# Patient Record
Sex: Female | Born: 1950 | Race: Black or African American | Hispanic: No | Marital: Married
Health system: Midwestern US, Academic
[De-identification: ages and names within clinical notes are randomized; demographics above are authoritative.]

## PROBLEM LIST (undated history)

## (undated) LAB — HEPATIC FUNCTION PANEL
ALT: 12 U/L
AST: 24 U/L
Albumin: 4.2 g/dL (ref 3.5–5.0)
Alkaline Phosphatase: 77 U/L
Total Bilirubin: 0.3 mg/dL (ref 0.1–1.4)
Total Protein: 7.8 g/dL (ref 6.4–8.2)

## (undated) LAB — RENAL FUNCTION PANEL W/O EGFR
BUN: 14 mg/dL (ref 4–21)
Calcium: 9.7 mg/dL (ref 8.7–10.7)
Chloride: 104 mmol/L (ref 99–108)
Creatinine: 0.89
Potassium: 4.3 mmol/L (ref 3.4–5.3)
Sodium: 142 mmol/L (ref 137–147)

## (undated) LAB — CBC
Hematocrit: 36.5 % (ref 36–46)
Hemoglobin: 12.5 g/dL (ref 12.0–16.0)
MCV: 88.4 fL (ref 82.0–108.0)
Platelets: 244
WBC: 4.6 10^3/mL

## (undated) LAB — RENAL FUNCTION PANEL W/EGFR
GFR MDRD Af Amer: 78
GFR MDRD Non Af Amer: 64

## (undated) LAB — HM COLONOSCOPY: HM Colonoscopy: NORMAL

## (undated) LAB — HM PAP SMEAR: HM Pap smear: NORMAL

---

## 2007-03-01 LAB — COMPREHENSIVE METABOLIC PANEL
A/G Ratio: 1.2 (ref 1.0–2.1)
ALT: 18 units/L (ref 6–40)
AST: 23 units/L (ref 10–35)
Albumin: 4.7 g/dL (ref 3.6–5.1)
Alkaline Phosphatase: 72 units/L (ref 33–130)
BUN: 15 mg/dL (ref 7–25)
CO2: 27 mmol/L (ref 21–33)
Calcium: 9.8 mg/dL (ref 8.6–10.2)
Chloride: 103 mmol/L (ref 98–110)
Creatinine: 0.9 mg/dL (ref 0.50–1.20)
GFR MDRD Af Amer: >60 mL/min (ref >=60)
GFR MDRD Non Af Amer: >60 mL/min (ref >=60)
Globulin, Total: 3.8 g/dL (ref 2.2–3.9)
Glucose: 75 mg/dL (ref 65–99)
Potassium: 4.7 mmol/L (ref 3.5–5.3)
Sodium: 138 mmol/L (ref 135–146)
Total Bilirubin: 0.4 mg/dL (ref 0.2–1.2)
Total Protein: 8.5 g/dL (ref 6.2–8.3)

## 2007-03-01 LAB — LIPID PANEL
Chol/HDL Ratio: 3.1 (ref <=5.0)
Cholesterol, Total: 206 mg/dL (ref 125–200)
HDL: 67 mg/dL (ref >=46)
LDL Cholesterol: 121 mg/dL (ref <130)
Triglycerides: 90 mg/dL (ref <150)

## 2007-03-01 NOTE — Unmapped (Signed)
Signed by Ronn Melena MD on 03/01/2007 at 00:00:00  Privacy Notice      Imported By: Scharlene Corn 03/04/2007 08:08:04    _____________________________________________________________________    External Attachment:    Please see [facility] EMR for this document.

## 2007-03-01 NOTE — Unmapped (Signed)
Signed by   [employer] on 03/01/2007 at 22:26:03  Patient: Amy Mcmillan  Note: All result statuses are Final unless otherwise noted.    Tests: (1) LIPID PANEL WITH REFLEX TO DIRECT LDL (QDL-14852)    TRIGLYCERIDES             90 mg/dL                    <469    CHOLESTEROL, TOTAL   [H]  206 mg/dL                   629-528    HDL CHOLESTEROL           67 mg/dL                    > OR = 46   LDL-CHOLESTEROL (calc)                              121 mg/dL                   <413             DESIRABLE RANGE <100 MG/DL FOR PATIENTS WITH CHD OR      DIABETES AND <70 MG/DL FOR DIABETIC PATIENTS WITH      KNOWN HEART DISEASE.          CHOL/HDLC RATIO (calc)                              3.1                         < OR = 5.0    Note: An exclamation mark (!) indicates a result that was not dispersed into   the flowsheet.  Document Creation Date: 03/01/2007 10:26 PM  _______________________________________________________________________    (1) Order result status: Final  Collection or observation date-time: 03/01/2007  Requested date-time:   Receipt date-time: 03/01/2007 20:40  Reported date-time: 03/01/2007 22:00  Referring Physician:    Ordering Physician:  Edrick Amidon [phone])  Specimen Source: S  Source: Lucien Mons Order Number: [id number]  Lab site: [facility]      [address]      [place]  [place]  [address]

## 2007-03-01 NOTE — Unmapped (Signed)
Signed by Sonia Side MA on 03/01/2007 at 07:54:57      Preload Clinical Lists   Problems added:   SPINAL STENOSIS, LUMBAR (ICD-724.02)  ALLERGIC RHINITIS (ICD-477.9)  OTHER ACNE (ICD-706.1)    Medications added:   ALLEGRA 180 MG  TABS (FEXOFENADINE HCL) as needed  B COMPLEX   TABS (B COMPLEX VITAMINS) daily  ENTEX   SUSP (PSEUDOEPHEDRINE TANNATE SUSP) as needed  VITAMIN D   TABS (CHOLECALCIFEROL TABS) daily  CALCIUM   TABS (CALCIUM TABS) daily      Past History  Past Medical History:  IBS, CTS-left hand  Surgical History:  BTL, breast lumpectomy    Family History: Mother - deceased, DM, uterine cancer  Cousin - colon cancer  Social History: Marital Status: married,   Alcohol Use: none  Tobacco Usage:non-smoker                Tetanus/Td Immunization History:     Tetanus/Td # 1:  Historical (08/18/2003)    Meningococcal Immunization History:     Meningococcal # 1:  Historical (02/08/1994)

## 2007-03-01 NOTE — Unmapped (Signed)
Signed by Edrick Beaverdale MD on 03/04/2007 at 12:13:03      Reason for Visit   Chief Complaint: CPE no pap    History from: patient    Allergies  No Known Allergies    Medications   ALLEGRA 180 MG  TABS (FEXOFENADINE HCL) as needed  B COMPLEX   TABS (B COMPLEX VITAMINS) daily  VITAMIN D   TABS (CHOLECALCIFEROL TABS) daily  CALCIUM   TABS (CALCIUM TABS) daily  FLAXSEED OIL   CAPS (FLAXSEED (LINSEED) CAPS) daily        Vital Signs:   Ht: 64 in.  Wt: 156 lbs.      BMI: 26.87  BSA: 1.76    Temperature: 98.2  degrees F  oral  Pulse: 72 (regular)    Patient appears to be in acute distress: no  BP: 132/76  Cuff size: large    Intake recorded by: Sonia Side MA on March 01, 2007 8:44 AM    Audiometry Screening   Left ear-500 hz: 25  Right ear-500 hz: 25  Left ear-1000 hz: 25  Right ear-1000 hz: 25  Left ear-2000 hz: 25  Right ear-2000 hz: 25  Left ear-4000 hz: 25  Right ear-4000 hz: 25          History of Present Illness     Menses   Still having periods? No  Menopausal Symptoms: hot flashes, vaginal dryness  Age at Menopause: 66, Natural  Comments: Last mammogram 11/08, wnl.  Next due in 1 year.  Wm Jorene Minors is her gynecologist.  Just started to use an OTC lubricant.  Comments: Pelvic exams perfomed by Dr. Jorene Minors.    Breast Cancer Screening   Performs BSE: Yes- reiterated importance of routine breast self exams  Hx Breast Biopsy: Yes  PMH Breast CA: Yes  FH Breast CA: Yes  Recent Breast Exam: yes  Comments: Breast examinations performed by Dr. Jorene Minors.  Recent Mammogram: yes  Mammogram: Normal  Comments: Continuing with annual screening.    Osteoporosis   She does not have documented osteoporosis. There is a family history of osteoporosis. She describes her calcium intake as: Takes a supplement.   Recent Dexascan: yes  Dexa: Abnormal Comments: Dr. Jorene Minors has recommended Boniva.  Pt has not begun to use.  Dexa was stable from 2007 to 2008.    Sexuality   Sexual Contact: yes      Constipation   Quality: hard  stools  Comments: does have daily BMs.  Passes a lot of gas. 4 to 8 oz of water a day only.  Walks daily over lunch break for 2- minutes a day.  Pt had gastric emptying study in 05/2005 which revealed mild gastroparesis.  She has also been evaluated by Bongiovanni who had started her on Zelnorm (per chart review).  Pt is no longer using zelnorm but states that it was effective in relieving sx.  LMP: January 2007    Previous Treatment:   Effective: fiber  Ineffective: laxative, stool softener  Comments: Pt has added fiber to her diet.  Pt has tried stool softeners and laxatives (OTC) without good relief.  Has FH of colon cancer.    Last colonoscopy in 2004.          Pt has spinal stenosis.  Uses Aleve, exercise, and stretching, PT exercises at home to control symptoms.  She gets numbness and tingling down bilat legs.  Has an awkward gait when she first starts walking until she gets stretched out.  She has a fear of crossing streets because she is not as fast or agile as she needs to be and would like a handicap sticker to use as needed.  Does not follow with a neurologist.  Was admitted for radicular symptoms bilat LE 2005, MRI dx was L4-L5 mild spinal stenosis.        Past History  Past Medical History:  spinal stenosis, IBS, CTS-left hand    Review of Systems   Refer to HPI for review of systems documentation.  Gastrointestinal: Complains of see HPI. Denies nausea, vomiting, diarrhea, constipation, change in bowel habits, abdominal pain, melena, hematochezia, jaundice, spitting, encopresis, hematemesis, abdominal distention, edema, ascites, belching, dysphagia, early satiety, heartburn/indigestion, regurgitation/reflux. Pt occassionally uses Pepcid to relieve bloating, doesn't work.  Genitourinary: Denies vaginal discharge, incontinence, dysuria, hematuria, urinary frequency, pelvic pain, incomplete empty, enuresis.   Allergic/Immunologic: Complains of hay fever. uses husband's allegra as needed which is adequate to  control symptoms.      Physical Examination:   BP: 132/  76    Physical Exam- Detail:   General Appearance: well-developed, well-nourished and in no acute distress.  Skin: No suspicious rashes or lesions.  Eyes: Sclera white, conjunctiva without injection and pallor.  PERRLA.  EOMI, nl fundi  Ears: No lesions.  Tympanic membranes translucent, non-bulging.  Canal walls pink, without discharge.  Hearing grossly intact.  Nose/Face: Mucosa and turbinates pink, septum midline. No polyps, no discharge, no lesions.  Oropharynx: Normal appearance.  No erythema, exudate or mass. No tonsillar swelling.  Respiratory: Respiration un-labored.  Lung fields clear to auscultation.  No wheezing, rales, rhonchi or pleural rub.  Neck: No thyromegaly.  No nodules, masses or tenderness.  Lymphatic: Areas palpated not enlarged:  cervical, supraclavicular, axillary.  Cardiac: S1 and S2 normal.  RRR without murmurs, rubs, gallops.  No JVD. ND PMI  Vascular: No carotid bruits.  No edema or varicosities.  Abdomen: No masses or tenderness. Bowel sounds active x4 quad.  Liver and spleen are without tenderness or enlargement.  No hernias.  No palpable stool.  Neurologic: Cranial nerves 2 through 12 intact.  Deep tendon reflexes 2+ bilaterally.  Sensation intact.  No spasticity.  Strength is 5/5 in upper and lower extremities bilaterally.  Psychiatric: Judgement and insight are within normal limits.  Alert and oriented x3.  No mood disorders noted, appropriate affect.  Musculoskeletal: Gait coordinated and smooth.  Digits are without clubbing or cyanosis.           Follow-up for Test Results:     New Problems:  WELL ADULT (ICD-V70.0)  CONSTIPATION (ICD-564.00)  SCREENING FOR LIPOID DISORDERS (ICD-V77.91)  Sx of DECREASED HEARING, LEFT EAR (ICD-389.9)  New Medications:  FLAXSEED OIL   CAPS (FLAXSEED (LINSEED) CAPS) daily  DULCOLAX 5 MG  TBEC (BISACODYL) Take one a day for constipation as needed.  NEURONTIN 300 MG  CAPS (GABAPENTIN) Take one  tablet today, then one tablet in the morning and at night on 11/22, then one tablet a day three times a day.      Preventive Maintenance     Performs Breast Self Exams: Yes- reiterated importance of routine breast self exams    Assessment and Plan  New Problems:  Dx of WELL ADULT (ICD-V70.0)  Onset: 03/01/2007  Dx of CONSTIPATION (ICD-564.00)  Onset: 03/01/2007  Dx of SCREENING FOR LIPOID DISORDERS (ICD-V77.91)  Onset: 03/01/2007  Sx of DECREASED HEARING, LEFT EAR (ICD-389.9)  Onset: 03/01/2007    Medications   New Prescriptions/Refills:  NEURONTIN  300 MG  CAPS (GABAPENTIN) Take one tablet today, then one tablet in the morning and at night on 11/22, then one tablet a day three times a day.  #93 tablets x 0, 03/01/2007, Edrick Ida MD  ALLEGRA 180 MG  TABS (FEXOFENADINE HCL) as needed  #30 x 1, 03/01/2007, Edrick Clay MD  DULCOLAX 5 MG  TBEC (BISACODYL) Take one a day for constipation as needed.  #30 x 0, 03/01/2007, Edrick [place]ton MD    New medications:  FLAXSEED OIL   CAPS -- daily  DULCOLAX 5 MG  TBEC -- Take one a day for constipation as needed.  Start date: 03/01/2007  NEURONTIN 300 MG  CAPS -- Take one tablet today, then one tablet in the morning and at night on 11/22, then one tablet a day three times a day.  Start date: 03/01/2007    Assessment and Plan Comments   1. Constipation- Reviewed helpful diet changes.  Dulcolax to be used every other day for next week.  Then as needed.  2. Decreased hearing- Nl hearing screen. Pt not interested in pursuing audiology evaluation at this time.    3. L4-5 spinal stenosis- Reviewed supportive mgmt and use of frequent position changes and increased activity.  Neurontin for symptoms relief.  Pt to being using at bedtime.   4. Labs drawn for FLP and CMP for cholesterol screening, dm screening.  Will communicate results by letter.  5. Declined flu shot.    Today's Orders   Audiometry / Pure Tone Hearing [CPT-92552]  [id number] - Ofc Vst, Est Level III [MWU-13244]  [id number] -  Preventive, Est, 40-64 yr [CPT-99396]  Lipid Panel w/Reflex to Direct LDL (14852) [CPT-80061]  Comp Metabolic Panel  (METAPNL) (10231) [CPT-80053]    Disposition:   Return to clinic for Doctor Visit as needed   Appointment Reason: Pt prefers not to schedule f/u at this time. Will call with concerns.      Patient Education   Education was provided to: patient  Patient Response: Expressed understanding, Receptive    Topics Discussed:   Medical Condition, Treatment Options, Theraputic Risks & Benefits, Medication Use.  Informed How: Verbally            Prescriptions:  NEURONTIN 300 MG  CAPS (GABAPENTIN) Take one tablet today, then one tablet in the morning and at night on 11/22, then one tablet a day three times a day.  #93 tablets x 0   Entered and Authorized by: Edrick Kingsford MD   Signed by: Edrick De Baca MD on 03/01/2007   Method used: Print then Give to Patient   RxID: [id number]  ALLEGRA 180 MG  TABS (FEXOFENADINE HCL) as needed  #30 x 1   Entered and Authorized by: Edrick [place] MD   Signed by: Edrick Woodbury MD on 03/01/2007   Method used: Print then Give to Patient   RxID: [id number]  DULCOLAX 5 MG  TBEC (BISACODYL) Take one a day for constipation as needed.  #30 x 0   Entered and Authorized by: Edrick Waves MD   Signed by: Edrick [place] MD on 03/01/2007   Method used: Print then Give to Patient   RxID: [id number]                  ]

## 2007-03-01 NOTE — Unmapped (Signed)
Signed by   LinkLogic on 03/01/2007 at 22:26:04  Patient: Amy Mcmillan  Note: All result statuses are Final unless otherwise noted.    Tests: (1) COMPREHENSIVE METABOLIC PANEL W/EGFR (QDL-10231)    GLUCOSE                   75 mg/dL                    87-56                  FASTING REFERENCE INTERVAL    UREA NITROGEN (BUN)       15 mg/dL                    4-33    CREATININE                0.9 mg/dL                   0.50-1.20   eGFR NON-AFR. AMERICAN                              >60 mL/min/1.21m2           > OR = 60   eGFR AFRICAN AMERICAN                              >60 mL/min/1.55m2           > OR = 60   BUN/CREATININE RATIO (calc)                              NOT APPLICABLE              6-22      BUN/CREATININE RATIO IS NOT REPORTED WHEN THE BUN      AND CREATININE VALUES ARE WITHIN NORMAL LIMITS.    SODIUM                    138 mmol/L                  135-146    POTASSIUM                 4.7 mmol/L                  3.5-5.3    CHLORIDE                  103 mmol/L                  98-110    CARBON DIOXIDE            27 mmol/L                   21-33    CALCIUM                   9.8 mg/dL                   2.9-51.8    PROTEIN, TOTAL       [H]  8.5 g/dL                    8.4-1.6    ALBUMIN  4.7 g/dL                    5.9-5.6    GLOBULIN (calc)           3.8 g/dL                    3.8-7.5   ALBUMIN/GLOBULIN RATIO (calc)                              1.2                         1.0-2.1    BILIRUBIN, TOTAL          0.4 mg/dL                   6.4-3.3    ALKALINE PHOSPHATASE      72 U/L                      33-130    AST                       23 U/L                      10-35    ALT                       18 U/L                      6-40    Note: An exclamation mark (!) indicates a result that was not dispersed into   the flowsheet.  Document Creation Date: 03/01/2007 10:26 PM  _______________________________________________________________________    (1) Order result status: Final  Collection or  observation date-time: 03/01/2007  Requested date-time:   Receipt date-time: 03/01/2007 20:40  Reported date-time: 03/01/2007 22:00  Referring Physician:    Ordering Physician:  Edrick Akeley [phone])  Specimen Source: S  Source: Lucien Mons Order Number: [id number]  Lab site: [facility]      [address]      [place]  [facility]  [address]      -----------------    The following non-numeric lab results were dispersed to  the flowsheet even though numeric results were expected:      BUN/CREATININE RATIO (calc), NOT APPLICABLE

## 2007-03-05 NOTE — Unmapped (Signed)
Signed by Edrick Franklin MD on 03/05/2007 at 15:14:03              March 05, 2007      Amy Mcmillan  [address]    [place], [place] [address]                                                                                           RE:  TEST RESULTS   MCPHEE Loja--11-03-1950)         Dear [name]:      The following is an interpretation of your most recent tests.  Please take note of any instructions provided.  Electrolyte studies:  Normal, Blood sugar normal         Glucose test normal:  75   Kidney function studies:  Normal    Liver function studies:  Normal    Lipid panel:   Normal - Total cholesterol is a little high at 206.  Normal is less than 200.       Triglyceride: 90   Cholesterol: 206   LDL: 121   HDL: 67   Chol/HDL%:  3.1       Additional Comments: Please review the enclosed information on high cholesterol.  It describes ways to change your diet to lower your cholesterol.  I think that any changes that you can make now will help keep your cholesterol from becoming a problem.  Please contact me with any questions.           Thank you,        Edrick Ashley MD

## 2007-06-28 NOTE — Unmapped (Signed)
Signed by Tresa Endo Dartis MA on 07/01/2007 at 09:13:34    Phone Note   Patient Call  Call back at Home Phone: [phone]  Caller: patient  Call for: Azalia Neuberger    Complaint: urinary problems  Summary of Call: Pt needs rx written for allegra d 1 daily as needed # 90 day supply   for new insurance [facility]   pt will pick up rx        Initial call taken by: Hardie Lora MA,  June 28, 2007 3:26 PM      Follow-up for Phone Call   Please print Rx and have another physician sign it or place it on my desk and i will sign on Monday.  Thank you.  Follow-up by: Edrick [place] MD,  June 28, 2007 5:01 PM    Additional Follow-up for Phone Call   Pick up rx in front office  phone call completed, left message for patient  Additional Follow-up by: Hardie Lora MA,  July 01, 2007 9:13 AM    Prescriptions:  ALLEGRA 180 MG  TABS (FEXOFENADINE HCL) as needed  #90 x 3   Entered and Authorized by: Edrick Kidron MD   Signed by: Tresa Endo Dartis MA on 07/01/2007   Method used: Print then Give to Patient   RxID: [id number]

## 2007-08-01 NOTE — Unmapped (Signed)
Signed by Edrick [place] MD on 08/05/2007 at 13:24:51      Reason for Visit   Chief Complaint: rash underarms, itchy, draining x 2 weeks    History from: patient    Allergies  ! * LATEX TAPE    Medications   ALLEGRA 180 MG  TABS (FEXOFENADINE HCL) as needed  B COMPLEX   TABS (B COMPLEX VITAMINS) daily  VITAMIN D   TABS (CHOLECALCIFEROL TABS) daily  CALCIUM   TABS (CALCIUM TABS) daily  FLAXSEED OIL   CAPS (FLAXSEED (LINSEED) CAPS) daily  DULCOLAX 5 MG  TBEC (BISACODYL) Take one a day for constipation as needed.        Vital Signs:   Wt: 158 lbs.      BMI: 27.22  BSA: 1.77  Wt chg (lbs): 2  Temperature: 97.8  degrees F  oral  Pulse: 78  BP: 110/70    Intake recorded by: Vista Lawman on August 01, 2007 4:07 PM    History of Present Illness   Chief Complaint: rash under arms  1. Rash beneath arms- Shaved under her arms with an old razor 1 wk ago.  Rash onset a few days afterward.  This has happened before.  No pain.  Feels wet, itching. Using Benadryl cream for itching. Noted clear drainage from under L arm.    2. Seasonal allergies- +Sinus congestion, rhinorrhea.  Needs refill on Allegra D.          Physical Examination:   BP: 110/  70    Physical Exam- Detail:   General Appearance: Well cared for female, comfortable, pleasant  Skin: Bilat axilla in region that pt shaved: prominent postinflammatory hyperpigmentation, at margins faint erythema and scale.  No pustules, vesicle, ulcers.  Lymphatic: No axillary lymphadenopathy appreciated           New Problems:  CANDIDIASIS, SKIN (ICD-112.3)  New Medications:  ALLEGRA-D 24 HOUR 180-240 MG TB24 (FEXOFENADINE-PSEUDOEPHEDRINE) Take one tablet by mouth daily for allergies  DIFLUCAN 100 MG TABS (FLUCONAZOLE) Take one tablet by mouth daily for fourteen days for rash  HYDROCORTISONE 1 % CREA (HYDROCORTISONE) apply to affected area twice a day as needed for itching  New Allergies:  ! * LATEX TAPE    Preventive Maintenance             Prescriptions:  HYDROCORTISONE 1 % CREA  (HYDROCORTISONE) apply to affected area twice a day as needed for itching  #qs x 0   Entered and Authorized by: Edrick Edinburgh MD   Signed by: Edrick [place] MD on 08/01/2007   Method used: Print then Give to Patient   RxID: [id number]  DIFLUCAN 100 MG TABS (FLUCONAZOLE) Take one tablet by mouth daily for fourteen days for rash  #14 x 0   Entered and Authorized by: Edrick [place] MD   Signed by: Edrick Bliss MD on 08/01/2007   Method used: Print then Give to Patient   RxID: [id number]  ALLEGRA-D 24 HOUR 180-240 MG TB24 (FEXOFENADINE-PSEUDOEPHEDRINE) Take one tablet by mouth daily for allergies  #30 x 3   Entered and Authorized by: Edrick Kountze MD   Signed by: Edrick [place] MD on 08/01/2007   Method used: Print then Give to Patient   RxID: [id number]      Assessment and Plan  New Problems:  Dx of CANDIDIASIS, SKIN (ICD-112.3)  Onset: 08/01/2007    Medications   New Prescriptions/Refills:  HYDROCORTISONE 1 % CREA (HYDROCORTISONE) apply to affected area twice a day as  needed for itching  #qs x 0, 08/01/2007, Edrick [place] MD  DIFLUCAN 100 MG TABS (FLUCONAZOLE) Take one tablet by mouth daily for fourteen days for rash  #14 x 0, 08/01/2007, Edrick [place] MD  ALLEGRA-D 24 HOUR 180-240 MG TB24 (FEXOFENADINE-PSEUDOEPHEDRINE) Take one tablet by mouth daily for allergies  #30 x 3, 08/01/2007, Edrick [place] MD    New medications:  ALLEGRA-D 24 HOUR 180-240 MG TB24 -- Take one tablet by mouth daily for allergies  Start date: 08/01/2007  DIFLUCAN 100 MG TABS -- Take one tablet by mouth daily for fourteen days for rash  Start date: 08/01/2007  HYDROCORTISONE 1 % CREA -- apply to affected area twice a day as needed for itching  Start date: 08/01/2007    Assessment and Plan Comments   1. Cutaneous candidiasis- Diflucan x 14 days, allegra will aid in controlling pruritis.  Call if symptoms fail to improve.  2. Seasonal allergies- Refilled Allegra D.    Today's Orders   99213 - Ofc Vst, Est Level III  [ATF-57322]    Disposition:   as needed

## 2007-08-27 NOTE — Unmapped (Signed)
Signed by Edrick Wabasso MD on 08/27/2007 at 23:00:48      Reason for Visit   Chief Complaint: fup canidasis. still there and spreading.     History from: patient    Allergies  ! * LATEX TAPE    Medications   B COMPLEX   TABS (B COMPLEX VITAMINS) daily  VITAMIN D   TABS (CHOLECALCIFEROL TABS) daily  CALCIUM   TABS (CALCIUM TABS) daily  FLAXSEED OIL   CAPS (FLAXSEED (LINSEED) CAPS) daily  DULCOLAX 5 MG  TBEC (BISACODYL) Take one a day for constipation as needed.  ALLEGRA-D 24 HOUR 180-240 MG TB24 (FEXOFENADINE-PSEUDOEPHEDRINE) Take one tablet by mouth daily for allergies  HYDROCORTISONE 1 % CREA (HYDROCORTISONE) apply to affected area twice a day as needed for itching        Vital Signs:   Wt: 158 lbs.      BMI: 27.22  BSA: 1.77  Wt chg (lbs): 0  Temperature: 98.2  degrees F  oral  Pulse: 76 (regular)    Patient appears to be in acute distress: no  BP: 132/72  Cuff size: large    Intake recorded by: Sonia Side MA on Aug 27, 2007 3:16 PM    History of Present Illness   Chief Complaint: yeast infxn unresolved  1. Yeast infxn under arms did not improve with diflucan.  Continues to have pruritis.  Using topical steroid and anti-itch cream.  No broken skin or d/c.    History of Present Illness: MEDICAL STUDENT NOTE (UC III)  57 yo AAF presents for f/u of axillary candidiasis in bilateral armpit.  The rash has been present for over a month now.  Pt. says the pruritis is being controlled with diphenhydramine cream.  Pt. was given fluconazole but pt. didn't feel like there was any improvement, in fact pt. feels the rash is spreading.  Pt. feels the rash has not changed in quality much     ROS:  Gen:  no fevers  Skin:  no rashes other than bilateral axilla.    PE:  Gen:  A&Ox3, NAD  Skin:  dark brown macular rash in axilla bilaterally,  4cm x 6-7cm, nontender, but pruritic to touch.  No d/c or drainage.    CV:  RRR, no murmurs  Resp: CTAB, no adventitious breath sounds    A/P:56 yo AAF with fluconazole-resistant  candidiasis.  Recommend Nyastatin topical cream, f/u in 2 weeks if no improvement.           Physical Examination:   BP: 132/  72    Physical Exam- Detail:   General Appearance: Well cared for female, comfortable, pleasant  Skin: Bilat axilla in region that pt shaved: prominent postinflammatory hyperpigmentation, at margins faint erythema and scale.  No pustules, vesicle, ulcers.  Lymphatic: No axillary lymphadenopathy appreciated           New Medications:  NYSTATIN 100000 UNIT/GM CREA (NYSTATIN) Apply to affected area three times a day      Preventive Maintenance             Prescriptions:  NYSTATIN 100000 UNIT/GM CREA (NYSTATIN) Apply to affected area three times a day  #qs x 0   Entered and Authorized by: Edrick [place] MD   Signed by: Edrick Stanwood MD on 08/27/2007   Method used: Print then Give to Patient   RxID: [id number]      Assessment and Plan   1. Cutaneous candidiasis- Topical nystatin cream for 2 days past symptom  resolution.      Medications   New Prescriptions/Refills:  NYSTATIN 100000 UNIT/GM CREA (NYSTATIN) Apply to affected area three times a day  #qs x 0, 08/27/2007, Edrick [place]uced[place] MD    Today's Orders   [phone] - Ofc Vst, Est Level III [FAO-13086]

## 2007-10-04 NOTE — Unmapped (Addendum)
Signed by Huel Coventry MD on 10/04/2007 at 16:11:55        Reason for Visit   Chief Complaint: both underarms itching, sore, swollen. wants something stronger. med refills    History from: patient    Allergies  ! * LATEX TAPE    Medications   B COMPLEX   TABS (B COMPLEX VITAMINS) daily  VITAMIN D   TABS (CHOLECALCIFEROL TABS) daily  CALCIUM   TABS (CALCIUM TABS) daily  FLAXSEED OIL   CAPS (FLAXSEED (LINSEED) CAPS) daily  DULCOLAX 5 MG  TBEC (BISACODYL) Take one a day for constipation as needed.  ALLEGRA-D 24 HOUR 180-240 MG TB24 (FEXOFENADINE-PSEUDOEPHEDRINE) Take one tablet by mouth daily for allergies  NYSTATIN 100000 UNIT/GM CREA (NYSTATIN) Apply to affected area three times a day        Vital Signs:   Wt: 158 lbs.      BMI: 27.22  BSA: 1.77  Wt chg (lbs): 0  Temperature: 97.8  degrees F  oral  Pulse: 72 (regular)    Patient appears to be in acute distress: no  BP: 128/70  Cuff size: large    Intake recorded by: Sonia Side MA on October 04, 2007 1:49 PM    History of Present Illness   57yo female here for F/U of B axillary infection.    Using Nystatin under arms twice a day since last visit.  No longer draining but it is still very pruritic and hyperpigmented rash.  Thinks this all started when she shaved in March with a razor of questionable cleanliness.    No fever, chills, lesions elsewhere.  Does mention hard stool, constipation.  No dark tarry stool no blood per rectum.  Coming up on time for repeat colonoscopy.    Past History  Past Medical History (reviewed - no changes required):  spinal stenosis, IBS, CTS-left hand  Social History (reviewed - no changes required): Marital Status: married,   Alcohol Use: none  Tobacco Usage:non-smoker    Review of Systems  Refer to HPI for review of systems documentation.      Physical Examination:   BP: 128/  70    Physical Exam- Detail:   General Appearance: Well cared for female, comfortable, pleasant  Skin: Bilateral axilla with area of hyperpigmentation with  well-defined borders, no satellite lesions, no pustules, vesicle, or ulcers.  No erythema.  Negative Wood lamp exam.  Negative for budding yeast as well as hyphae on KOH slide.  Respiratory: Respiration un-labored.  Lung fields clear to auscultation.  No wheezing, rales, rhonchi or pleural rub.  Cardiac: S1 and S2 normal.  RRR without murmurs, rubs, gallops.    Psychiatric: Judgement and insight are within normal limits.  Alert and oriented x3.  No mood disorders noted, appropriate affect.  Musculoskeletal: Gait coordinated and smooth.        In office Procedures & Tests     Procedure Note:   Type of Procedure: KOH slide prep  Comments: Scrapings of axillary skin placed on slide with KOH; negative on microscopy for  budding yeast,          New Medications:  TERBINAFINE HCL 250 MG TABS (TERBINAFINE HCL) 1 tab by mouth daily for 4 weeks      Preventive Maintenance             Prescriptions:  DULCOLAX 5 MG  TBEC (BISACODYL) Take one a day for constipation as needed.  #30 x 0   Entered and Authorized by:  Joellen Jersey MD   Signed by: Joellen Jersey MD on 10/04/2007   Method used: Print then Give to Patient   RxID: [id number]  TERBINAFINE HCL 250 MG TABS (TERBINAFINE HCL) 1 tab by mouth daily for 4 weeks  #30 x 0   Entered and Authorized by: Joellen Jersey MD   Signed by: Joellen Jersey MD on 10/04/2007   Method used: Print then Give to Patient   RxID: [id number]      Assessment and Plan   57yo female with dermatitis in bilateral axillae.  -- Appear today less c/w Candida, more likely tinea corporis  -- Inadequate response to topical tx, will start terbinafine 250mg  daily for 4 weeks  -- Pt to return in 3-4 weeks to ensure it is clearing up    Constipation:  -- Refill on dulcolax, recommended fiber supplement, also can try Miralax prn    Medications   New Prescriptions/Refills:  DULCOLAX 5 MG  TBEC (BISACODYL) Take one a day for constipation as needed.  #30 x 0, 10/04/2007, Joellen Jersey  MD  TERBINAFINE HCL 250 MG TABS (TERBINAFINE HCL) 1 tab by mouth daily for 4 weeks  #30 x 0, 10/04/2007, Joellen Jersey MD                        Signed by Joellen Jersey MD on 10/04/2007 at 16:22:37

## 2007-10-29 NOTE — Unmapped (Addendum)
Signed by Cheron Schaumann MA on 10/29/2007 at 11:15:00    Dermatology Procedure      OPERATIVE REPORT:  SKIN BIOPSY   Site #1:   left medial axilla  Differential Diagnosis #1:   r/o chronic lichenified dermatitis  Site #2:   left lateral axilla  Differential Diagnosis #2:   r/o chronic lichenified dermatitis    Procedure in Detail:   With the patient in the appropriate position, the perilesional and lesional skin of lesion(s) ofwas scrubbed with  alcohol.    1% lidocaine with epinephrine 1:200000.  Lesional skin was incised with 1/2 blade  The specimen(s) was sent for histopathologic examination.  Hemostasis was obtained by  aluminum chloride.  Estimated blood loss was less than 1 ml.      Discharge Plans:    we will contact 7-10 days.        Patient Instructions:   Topical antibiotic ointment, Band-Aids, and wound care instructions provided.   Written biopsy wound care instructions provided.  Further treatment plans, if applicable, will be made at that time.    Signed by Regis Bill MA on 11/06/2007 at 11:41:33            Pathology Results   Site #1:   L medical axilla      Result #1:   Chronic lichenified dermatitis      Action #1:   ED will go over results at F/u   Site #2:   L lateral axilla     Result #2:   Chronic lichenified dermatitis      Action #2:   ED will go over results at F/u

## 2007-10-29 NOTE — Unmapped (Signed)
Signed by Cheron Schaumann MA on 10/29/2007 at 11:16:30      Dermatopathology Requisition Form   Ordering Provider:   Frazier Richards, md  Biopsy Date:   10/29/2007  Gender:  Female  Patient Race:   Black  Social Security #:  [id number]    Impression:   r/o chronic lichenified dermatitis,     also gms stain    Biopsy Site(s):   left medial axilla  left lateral axilla    Tests Requested:    DermPath Biopsy:  Histopathology [id number] [[id number]]

## 2007-10-29 NOTE — Unmapped (Signed)
Signed by Frazier Richards MD on 10/29/2007 at 12:21:56      History of Present Illness   Chief Complaint:   CHECK RASH UNDER ARM  1. over 5 mo h/o hihgly pruritic eruption under both axillae  no change with nystatin cream  and one month terbinafine cream  (previous KOH negative)  no new deoderants or shaving gels or equipment    2. h/o adult acne well controlled with retin a 0.025%A cream  would lifk erefills  otherwise no known modifying factors            Past History  Past Medical History (reviewed - no changes required):  spinal stenosis, IBS, CTS-left hand  Surgical History (reviewed - no changes required):  BTL, breast lumpectomy    Family History: DENIES FAMILY HX MM  Mother - deceased, DM, uterine cancer  Cousin - colon cancer      Dermatology Past History   Personal History of Skin Cancer/Melanoma:   No  Sunburns Easily:   No  Uses Sunscreen:   No      Intake-Dermatology     Vital Signs     Height: 64   Primary Care Provider: [facility]      Allergies  ! * LATEX TAPE  Allergy and adverse reaction list reviewed during this update.    Current Medications:    Current Meds:   B COMPLEX   TABS (B COMPLEX VITAMINS) daily  VITAMIN D   TABS (CHOLECALCIFEROL TABS) daily  CALCIUM   TABS (CALCIUM TABS) daily  FLAXSEED OIL   CAPS (FLAXSEED (LINSEED) CAPS) daily  DULCOLAX 5 MG  TBEC (BISACODYL) Take one a day for constipation as needed.  ALLEGRA-D 24 HOUR 180-240 MG TB24 (FEXOFENADINE-PSEUDOEPHEDRINE) Take one tablet by mouth daily for allergies  NYSTATIN 100000 UNIT/GM CREA (NYSTATIN) Apply to affected area three times a day  TERBINAFINE HCL 250 MG TABS (TERBINAFINE HCL) 1 tab by mouth daily for 4 weeks      Intake recorded by: Cheron Schaumann MA  October 29, 2007 10:59 AM      Review of Systems   Skin: o/w feels well,   no other cutaneous c/o      Physical Examination:     1. bilateral axillae well demarcated dark brown lichenified 8 cm plaques  2. face o/w clear  Full Upper body skin exam was otherwise  within normal limits, including chest, back, arms, neck, face, scalp, and abdomen.      Assessment and Plan    Problem #1:  ERYTHEMA NOS (ICD-695.9)  Plan:  r/o Chronic lichenified dermatitis  check GMs  appearance also akin to AN, though pruritus is hihgly atypical    4.0 punch x 2   punch biopsy in the usual fashion  1% lido with epi, 4.0 silk  suture removal: *2weeks  consent on chart  edu: wound care, bleeding, infection, scar  no immediate complications    will call  with results when available  d/c all topicals for now      Problem #2:  OTHER ACNE (ICD-706.1)  Plan:  acne  clear  continue retin -a      Medications   New Prescriptions/Refills:  TRETINOIN 0.025 % CREA (TRETINOIN) to face every hs  #45 grams x 5, 10/29/2007, Frazier Richards MD    Today's Orders   [phone] - Ofc Vst, New Level III [CPT-99203]  11100 - Biopsy, skin, any site first lesion [CPT-11100]  11101 - Biopsy, skin, any site,  each add'l. [CPT-11101]    Prescriptions:  TRETINOIN 0.025 % CREA (TRETINOIN) to face every hs  #45 grams x 5   Entered and Authorized by: Frazier Richards MD   Signed by: Frazier Richards MD on 10/29/2007   Method used: Print then Give to Patient   RxID: [id number]

## 2007-11-12 NOTE — Unmapped (Signed)
Signed by Frazier Richards MD on 11/12/2007 at 16:20:10      History of Present Illness   Date of Last Visit:   10/29/2007  Chief Complaint:   Suture removal  57 yo aafm    1.  Suture Removal - Wound well approximated.  No sign or symptom of infection.  Sutures wre not present.        yhere to discuss bx results  1. lesions in bilateral axillae  no current prescription  failed nystatin and terbenafine  by mouth x 1 month  hihgly pruritic  bx: CLD  negative GMS      Past History  Past Medical History (reviewed - no changes required):  spinal stenosis, IBS, CTS-left hand  Surgical History (reviewed - no changes required):  BTL, breast lumpectomy    Family History (reviewed - no changes required): DENIES FAMILY HX MM  Mother - deceased, DM, uterine cancer  Cousin - colon cancer  Social History (reviewed - no changes required): Marital Status: married,   Alcohol Use: none  Tobacco Usage:non-smoker      Dermatology Past History   Personal History of Skin Cancer/Melanoma:   No  Sunburns Easily:   No  Uses Sunscreen:   No      Intake-Dermatology     Vital Signs     Height: 64   Primary Care Provider: [facility]      Allergies  ! * LATEX TAPE  Allergy and adverse reaction list reviewed during this update.    Current Medications:    Current Meds:   B COMPLEX   TABS (B COMPLEX VITAMINS) daily  VITAMIN D   TABS (CHOLECALCIFEROL TABS) daily  CALCIUM   TABS (CALCIUM TABS) daily  FLAXSEED OIL   CAPS (FLAXSEED (LINSEED) CAPS) daily  DULCOLAX 5 MG  TBEC (BISACODYL) Take one a day for constipation as needed.  ALLEGRA-D 24 HOUR 180-240 MG TB24 (FEXOFENADINE-PSEUDOEPHEDRINE) Take one tablet by mouth daily for allergies  NYSTATIN 100000 UNIT/GM CREA (NYSTATIN) Apply to affected area three times a day  TERBINAFINE HCL 250 MG TABS (TERBINAFINE HCL) 1 tab by mouth daily for 4 weeks  TRETINOIN 0.025 % CREA (TRETINOIN) to face every hs      Intake recorded by: Jenean Lindau MA  November 12, 2007 4:09 PM      Review of Systems      General: Reports no specific concern.  Allergic/Immunologic: o/w feels well,   no other cutaneous c/o  Reports no specific concern.    Physical Examination:     1. bot axillae: well demarcated lichenified dark  brown plaques        Assessment and Plan:   1. chronic lichenified dermatitis of the axillae  unclear what the initiating factor was  Triamcinalone 0.1% cream twice a day to affected active areas as needed   edu what activity is, when to apply   re-eval in 3 weeks (if she has to miss this appt, please don't continue--she agrees!)  edu: misuse, atrophy, dyspigmentation, telengiectasia    Prescriptions:  TRIAMCINOLONE ACETONIDE 0.1 % CREA (TRIAMCINOLONE ACETONIDE) apply to area twice a day  #15 grams x 3   Entered by: Jenean Lindau MA   Authorized by: Frazier Richards MD   Signed by: Jenean Lindau MA on 11/12/2007   Method used: Print then Give to Patient   RxID: [id number]

## 2007-11-29 NOTE — Unmapped (Signed)
Signed by Regis Bill MA on 12/19/2007 at 09:10:51    PHONE NOTE - Call from Pharmacy      Pharmacy Name: [facility]  Caller: karen  Pharmacy Fax Number: [phone]    Reason for Call: medication preauthorization, pt needs a p.a. for retin-a.    Initial call taken by: Suzanna Obey,  November 29, 2007 1:15 PM      FOLLOW UP  Called pt. LMOM to call back.   left message for patient  Follow-up by: Regis Bill MA,  December 02, 2007 9:49 AM    ADDITIONAL FOLLOW UP   [phone]     i called [facility]: number above  the pa number to call is [phone]  the ID number is:   [id number]    thanks!    Follow-up by: Frazier Richards MD,  December 03, 2007 4:16 PM    ADDITIONAL FOLLOW UP  Called INS to start PA for pt. on retin-a. INS states that they will fax over information that will need to be signed and faxed back.   Follow-up by: Regis Bill MA,  December 04, 2007 9:32 AM    ADDITIONAL FOLLOW UP  pt called back wanting to know the status on the pa  Follow-up by: Suzanna Obey,  December 13, 2007 1:08 PM    ADDITIONAL FOLLOW UP  Called pharm to check on PA. Pharm states that they did not get all of the information that was needed. INS states that they will refax the papers.   Follow-up by: Regis Bill MA,  December 13, 2007 3:41 PM    ADDITIONAL FOLLOW UP  Faxed all information back to the pharm.   Follow-up by: Regis Bill MA,  December 17, 2007 9:14 AM    ADDITIONAL FOLLOW UP  Called INS to check of pts. PA. INS states that they are going to cover the med for up to one year. Called pt. LMOM for her to call back with any questions.   phone call completed, called patient, left message for patient  Follow-up by: Regis Bill MA,  December 19, 2007 9:09 AM

## 2007-12-03 NOTE — Unmapped (Signed)
Signed by Frazier Richards MD on 12/03/2007 at 16:22:52      History of Present Illness   Chief Complaint:   follow up  57 yo aafm    1.  3 week follow up - itching was better, but now pt is itching again.    agree eb  1. pruritic eruption in the axillae  current regimen: Triamcinalone 0.1, tinactin, lamisil, and benadryl cream, not as discussed at the previous visit    initiall improved, now recurring  never cleared  mildly pruritic      Past History  Past Medical History (reviewed - no changes required):  spinal stenosis, IBS, CTS-left hand  Surgical History (reviewed - no changes required):  BTL, breast lumpectomy    Family History (reviewed - no changes required): DENIES FAMILY HX MM  Mother - deceased, DM, uterine cancer  Cousin - colon cancer  Social History (reviewed - no changes required): Marital Status: married,   Alcohol Use: none  Tobacco Usage:non-smoker      Dermatology Past History   Personal History of Skin Cancer/Melanoma:   No  Sunburns Easily:   No  Uses Sunscreen:   No      Intake-Dermatology     Vital Signs     Height: 64   Primary Care Provider: [facility]      Allergies  ! * LATEX TAPE  Allergy and adverse reaction list reviewed during this update.    Current Medications:    Current Meds:   B COMPLEX   TABS (B COMPLEX VITAMINS) daily  VITAMIN D   TABS (CHOLECALCIFEROL TABS) daily  CALCIUM   TABS (CALCIUM TABS) daily  FLAXSEED OIL   CAPS (FLAXSEED (LINSEED) CAPS) daily  DULCOLAX 5 MG  TBEC (BISACODYL) Take one a day for constipation as needed.  ALLEGRA-D 24 HOUR 180-240 MG TB24 (FEXOFENADINE-PSEUDOEPHEDRINE) Take one tablet by mouth daily for allergies  NYSTATIN 100000 UNIT/GM CREA (NYSTATIN) Apply to affected area three times a day  TERBINAFINE HCL 250 MG TABS (TERBINAFINE HCL) 1 tab by mouth daily for 4 weeks  TRETINOIN 0.025 % CREA (TRETINOIN) to face every hs  TRIAMCINOLONE ACETONIDE 0.1 % CREA (TRIAMCINOLONE ACETONIDE) apply to area twice a day      Intake recorded by: Jenean Lindau MA  December 03, 2007 4:07 PM      Review of Systems   General: Reports no specific concern.  Allergic/Immunologic: o/w feels well,   no other cutaneous c/o  Reports no specific concern.    Physical Examination:     both axillae well demarcated brown lichenified plaques  no signs of atrophy      Assessment and Plan:   1. chronic lichenified dermatitis of the axillae, now strongly suspect contact allergy  d/c benadryl cream!  d/c all except lamisil twice a day and Triamcinalone 0.1%   call in 4 weeks if not clear  edu: misuse, atrophy, dyspigmentation, telengiectasia  edu: not safe to continue Triamcinalone for longer (has limited supply)   consider protopic in the future

## 2007-12-12 NOTE — Unmapped (Signed)
Signed by Edrick [place] MD on 12/12/2007 at 00:00:00  notice for schedule follow up      Imported By: Scharlene Corn 12/31/2007 08:11:49    _____________________________________________________________________    External Attachment:    Please see [employer] EMR for this document.

## 2008-03-10 NOTE — Unmapped (Signed)
Signed by Edrick Buna MD on 03/10/2008 at 21:36:52      Reason for Visit   Chief Complaint: itchy/scratchy throat, trouble breathing at night, stuffy/runny nose, cough since friday    History from: patient    Allergies  ! * LATEX TAPE    Medications   B COMPLEX   TABS (B COMPLEX VITAMINS) daily  VITAMIN D   TABS (CHOLECALCIFEROL TABS) daily  CALCIUM   TABS (CALCIUM TABS) daily  FLAXSEED OIL   CAPS (FLAXSEED (LINSEED) CAPS) daily  DULCOLAX 5 MG  TBEC (BISACODYL) Take one a day for constipation as needed.  ALLEGRA-D 24 HOUR 180-240 MG TB24 (FEXOFENADINE-PSEUDOEPHEDRINE) Take one tablet by mouth daily for allergies        Vital Signs:   Wt: 159 lbs.      BMI: 27.39  BSA: 1.78  Wt chg (lbs): 1  Temperature: 97.8  degrees F  oral  Pulse: 84 (regular)    Patient appears to be in acute distress: no  BP: 132/82  Cuff size: regular    Intake recorded by: Sonia Side MA on March 10, 2008 3:55 PM    History of Present Illness   Chief Complaint: sinus congestion  Sinus congestion- Onset 5 days ago.  Started with itchy throat.  Can't breathe at night because her sinuses are so congested.  Using mucinex, salt water gargles, clarinex, allegra, tylenol sinus.  Has provided a little relief.  No fever.  Feels tired.  No myalgias. Lots of postnasal drip and cough nonproductive.    2. Has breast mass that is to  be biopsied.  Appt w/ surgeon 12/2.    3. Noted a mass over her right shoulder af few months ago.  No changes. Not tender. No redness or calor.  No shoulder injury.    Past History  Past Medical History:  spinal stenosis, IBS, CTS-left hand  Social History: Marital Status: married,   Employment Status: employed full-time,   Occupation: Photographer  Alcohol Use: none  Tobacco Usage:non-smoker        Physical Examination:   BP: 132/  82    Physical Exam- Detail:   General Appearance: Well cared for female, comfortable, congested and fatigued appearing  Skin: warm and dry  Eyes: Sclera white, conjunctiva without  injection and pallor.  PERRLA.  EOMI. bilat allergic shiners  Ears: serous effusion bilat, no TM erythema or bulging  Nose/Face: turbinates edematous, clear rhinorrhea. Mild R maxillary tenderness  Oropharynx: posterior injection  Oral Cavity: Gums pink, good dentition.  Oral mucosa and tongue without lesions.  Respiratory: Respiration un-labored.  Lung fields clear to auscultation.  No wheezing, rales, rhonchi or pleural rub.  Neck: No thyromegaly.  No nodules, masses or tenderness.  Lymphatic: Areas palpated not enlarged:  cervical, supraclavicular.  Cardiac: Nl precordium, S1S2 RRR no ectopy  Psychiatric: Judgement and insight are within normal limits.  Alert and oriented x3.  No mood disorders noted, appropriate affect.  Right Upper Extremity: Well circumscribed fluctuant mass overlying anterolat shoulder.  No erythema, calor, tenderness, not mobile. FROM.           New Problems:  URI (ICD-465.9)  LOCALIZED SUPERFICIAL SWELLING MASS OR LUMP (ICD-782.2)      Preventive Maintenance       Coordinating Care Providers   PCP Name: [facility]              Assessment and Plan   1. URI- Supportive therapy including loratadine, afrin,  humidity.  2. Superficial mass R shoulder- Suspect lipoma or simple cyst.  Less likely bursitis as nontender.  Will evaluated further with [place].  Will call pt with results.    Problems New Problems:  Dx of URI (ICD-465.9)  Onset: 03/10/2008  Dx of LOCALIZED SUPERFICIAL SWELLING MASS OR LUMP (ICD-782.2)  Onset: 03/10/2008  Today's Orders   Ultrasound [IMS-11111]  99213 - Ofc Vst, Est Level III [FIE-33295]

## 2008-03-14 NOTE — Unmapped (Signed)
Signed by Edrick [place]is MD on 03/14/2008 at 00:00:00  ultrasound right shoulder      Imported By: Scharlene Corn 10/05/2008 11:48:13    _____________________________________________________________________    External Attachment:    Please see [employer] EMR for this document.

## 2008-05-07 NOTE — Unmapped (Signed)
Signed by Edrick [place] MD on 05/07/2008 at 00:00:00  Hematology/Oncology      Imported By: Scharlene Corn 05/13/2008 08:15:19    _____________________________________________________________________    External Attachment:    Please see [employer] EMR for this document.

## 2008-05-11 NOTE — Unmapped (Signed)
Signed by Vista Lawman on 05/11/2008 at 15:17:55    [facility]        May 11, 2008      [place]  [address]  [place], [place]  [address]    [facility]  [address]  [place], [place] [address]  [phone]      Dear  Ms. Amy Mcmillan,  Your Provider had previsously given you an order for testing to be done or for a consultation with another Provider. During a review of your records it appears that you have not had this completed.  At your next visit please let us know if you had the test performed or if you have chosen not to have it done.  If you have had the testing completed please disregard this letter.  (Breast Ultrasound)    [facility]

## 2008-05-27 NOTE — Unmapped (Signed)
Signed by Edrick Wilder MD on 05/27/2008 at 00:00:00  Hematology/Oncology      Imported By: Scharlene Corn 06/01/2008 13:56:36    _____________________________________________________________________    External Attachment:    Please see [employer] EMR for this document.

## 2008-06-18 NOTE — Unmapped (Signed)
Signed by Edrick [place] MD on 06/18/2008 at 00:00:00  Hematology/Oncology      Imported By: Scharlene Corn 06/24/2008 08:39:19    _____________________________________________________________________    External Attachment:    Please see [employer] EMR for this document.

## 2008-07-07 NOTE — Unmapped (Signed)
Signed by Edrick [place] MD on 07/07/2008 at 00:00:00  Hematology/Oncology      Imported By: Scharlene Corn 07/13/2008 08:08:39    _____________________________________________________________________    External Attachment:    Please see [employer] EMR for this document.

## 2008-07-30 NOTE — Unmapped (Signed)
Signed by   LinkLogic on 07/31/2008 at 01:16:33  Patient: Amy Mcmillan  Note: All result statuses are Final unless otherwise noted.    Tests: (1)  (MR)    Order Note:                           THE [facility]                              [facility]                        RADIATION ONCOLOGY SIMULATION NOTE     Andringa, Gauri D.                               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  Status post lumpectomy with sentinel lymph node  biopsy/axillary dissection on 04/24/2008.  Status post Cytoxan and Taxotere  every three weeks x4 cycles.     PRETREATMENT CLINICAL HISTORY:  The patient is a very pleasant, 58 year old  lady who was found to have a mass lesion in her left breast on routine  mammogram performed at [facility] in 02/2008.  A 10 mm mass with  irregular margins was noted to be present.  The presence of an abnormal mass  was confirmed with the use of ultrasound.  A core needle biopsy was performed  on 03/17/2008 and this diagnosed low grade invasive ductal carcinoma.  The  tumor was ER positive, PR equivocal and HER-2/neu negative.  MRI scanning  which was performed on 03/31/2008 confirmed the presence of enhancing  carcinoma at the 12 o'clock position in the left breast.  On 04/24/2008 the  patient underwent lumpectomy with sentinel lymph node biopsy and left  axillary dissection.  Micrometastatic carcinoma was found within one sentinel  lymph node.  Eight other regional lymph nodes were negative for evidence of  metastatic disease.  Within the left breast lumpectomy a 3 x 2.5 mm residuum  of infiltrating carcinoma was found to be present.  Margins of excision were  negative.  The tumor was  intermediate grade.  Per AJCC staging the patient  has a T1a, N28mi.  The patient received Cytoxan and Taxotere every three weeks  for four cycles.  She will be receiving radiation therapy to the whole left  breast as part of breast conservation therapy.  Given that there was only  micrometastatic disease found within one sentinel lymph node and eight other  lymph nodes were all negative for evidence of metastatic disease the  supraclavicular region will not be treated.  Today's dictation  is in regards  to the fiducial simulation.     TREATMENT PLAN:  The patient will receive 5000 cGy delivered in 25 fractions  of 200 cGy each to the whole left breast.  Following this a boost of 1000 cGy  will be delivered in 5 fractions of 200 cGy each to the surgical bed within  the left breast.  Three dimensional CRT treatment planning is being employed.     DETAILS OF SIMULATION:  The patient was asked to lie supine in the treatment  position with her arms extended above her head resting on a wing board and a  vac-fix mold.  These devices were used to help immobilize the patient during  treatment.  A radiopaque marker was used to demarcate the surgical scar in  the periareolar region.  Radiopaque markers were also to demarcate the  midaxillary and midsternal lines, a line 2 cm below the inframammary fold and  a line at the level of the left clavicular head.  Preliminary fiducial  markers were placed and the patient then underwent CT scanning in the  treatment planning position.     CONSENT:  The technique, rational, expectations and potential side effects of  radiation therapy have been discussed with the patient and she has had a  chance to ask questions and to have these answered.  She wishes to move  forward with treatment and consent for treatment has been obtained.                                                 _______________________________________  CM/nen                                 _____  D:  07/30/2008 10:31                    Marciano Sequin, M.D.  T:  07/31/2008 01:04  Job #:  [id number]                                 RADIATION ONCOLOGY SIMULATION NOTE                                                               PAGE    1 of   1    Note: An exclamation mark (!) indicates a result that was not dispersed into   the flowsheet.  Document Creation Date: 07/31/2008 1:16 AM  _______________________________________________________________________    (1) Order result status: Final  Collection or observation date-time: 07/30/2008 00:00  Requested date-time:   Receipt date-time:   Reported date-time:   Referring Physician: Marciano Sequin  Ordering Physician:  Reviewed In Hospital [facility])  Specimen Source:   Source: DBS  Filler Order Number: [id number]  Lab site:

## 2008-08-04 NOTE — Unmapped (Signed)
Signed by   LinkLogic on 08/05/2008 at 00:37:59  Patient: Amy Mcmillan  Note: All result statuses are Final unless otherwise noted.    Tests: (1)  (MR)    Order Note:                           THE [facility]                              [facility]                        RADIATION ONCOLOGY SIMULATION NOTE     Monaco, Dajane D.                               DATE SEEN:  08/04/2008  DOB:  03-03-1951                                MRN:  [id number]                                                  ACCT:        RADIATION ONCOLOGY SIMULATION NOTE  DATE OF SIMULATION:  08/04/08     DIAGNOSIS(ES):  Invasive ductal carcinoma of the left breast (stage II-A,  T1a, N1, M0).  Status post lumpectomy with sentinel lymph node  biopsy/axillary dissection on 04/24/2008.  Status post Cytoxan and Taxotere  every three weeks x4 cycles.     PRETREATMENT CLINICAL HISTORY:  The patient is a very pleasant, 57 year old  lady who was found to have a mass lesion in her left breast on routine  mammogram performed at [facility] in 02/2008.  A 10 mm mass with  irregular margins was noted to be present.  The presence of an abnormal mass  was confirmed with the use of ultrasound.  A core needle biopsy was performed  on 03/17/2008 and this diagnosed low grade invasive ductal carcinoma.  The  tumor was ER positive, PR equivocal and HER-2/neu negative.  MRI scanning  which was performed on 03/31/2008 confirmed the presence of enhancing  carcinoma at the 12 o'clock position in the left breast.  On 04/24/2008 the  patient underwent lumpectomy with sentinel lymph node biopsy and left  axillary dissection.  Micrometastatic carcinoma was found within one sentinel  lymph node.  Eight other regional lymph nodes were negative for evidence of  metastatic disease.  Within the left breast lumpectomy a 3 x 2.5 mm residuum  of infiltrating carcinoma was found to be present.  Margins of excision were  negative.  The tumor was  intermediate grade.  Per AJCC staging the patient  has a T1a, N47mi.  The patient received Cytoxan and Taxotere every three weeks  for four cycles.  She will be receiving radiation therapy to the whole left  breast as part of breast conservation therapy.  Given that there was only  micrometastatic disease found within one sentinel lymph node and eight other  lymph nodes were all negative for evidence of metastatic disease the  supraclavicular region will not be treated.  Today's dictation  is in regards  to the applied plan.     TREATMENT PLAN:  The patient will receive 5000 cGy delivered in 25 fractions  of 200 cGy each to the whole left breast.  Following this a boost of 1000 cGy  will be delivered in 5 fractions of 200 cGy each to the surgical bed within  the left breast.  Three dimensional CRT treatment planning is being employed.     DETAILS OF SIMULATION:  The previously obtained CT scan of the patient in the  treatment position was brought to the treatment planning computer.  The heart  and left lung were contoured as organs at risk.  Isocenter placement and  Gantry angle were optimized.  Field in Chief Executive Officer will be employed.  From the lateral medial direction three beams will be used.  One of these  will be treated with 6 megavolt photons and two will be treated with 15  megavolt photons.  Each of these beams will have a 30 degree wedge and a  custom block.  From the medial lateral direction similarly three beams will  be employed.  One of these will be treated with 6 megavolt photons and two  with 15 megavolt photons.  Each of these will have a custom block.  A total  of six custom blocks will be employed and three wedges will be employed.  The  final isodose distribution was examined and found to acceptably encompass the  area of concern.  A dose volume histogram was examined and it was determined  the dosed organs at risk was kept well within tolerance limits.     CONSENT:  The technique, rational,  expectations and potential side effects of  radiation therapy have been discussed with the patient and she has had a  chance to ask questions and to have these answered.  She wishes to move  forward with treatment and consent for treatment has been obtained.                                                 _______________________________________  CM/smk                                 _____  D:  08/04/2008 13:52                   Marciano Sequin, M.D.  T:  08/05/2008 00:29  Job #:  [id number]                                 RADIATION ONCOLOGY SIMULATION NOTE                                                               PAGE    1 of   1    Note: An exclamation mark (!) indicates a result that was not dispersed into   the flowsheet.  Document Creation Date: 08/05/2008 12:37 AM  _______________________________________________________________________    (1) Order result status: Final  Collection or  observation date-time: 08/04/2008 00:00  Requested date-time:   Receipt date-time:   Reported date-time:   Referring Physician: Marciano Sequin  Ordering Physician:  Reviewed In Hospital [facility])  Specimen Source:   Source: DBS  Filler Order Number: [id number]  Lab site:

## 2008-08-27 NOTE — Unmapped (Signed)
Signed by   LinkLogic on 08/28/2008 at 01:06:28  Patient: Amy Mcmillan  Note: All result statuses are Final unless otherwise noted.    Tests: (1)  (MR)    Order Note:                           THE [facility]                              [facility]                        RADIATION ONCOLOGY SIMULATION NOTE     Smolinski, Calle D.                               DATE SEEN:  08/27/2008  DOB:  [birth year]                                MRN:  [id number]                                                  ACCT:        RADIATION ONCOLOGY SIMULATION NOTE  DATE OF SIMULATION:  08/27/2008     DIAGNOSIS(ES):  Invasive ductal carcinoma of the left breast (stage II-A,  T1a, N1, M0).  Status post lumpectomy with sentinel lymph node  biopsy/axillary dissection on 04/24/2008.  Status post Cytoxan and Taxotere  every three weeks x4 cycles.     PRETREATMENT CLINICAL HISTORY:  The patient is a very pleasant, 58 year old  lady who was found to have a mass lesion in her left breast on routine  mammogram performed at [facility] in 02/2008.  A 10 mm mass with  irregular margins was noted to be present.  The presence of an abnormal mass  was confirmed with the use of ultrasound.  A core needle biopsy was performed  on 03/17/2008 and this diagnosed low grade invasive ductal carcinoma.  The  tumor was ER positive, PR equivocal and HER-2/neu negative.  MRI scanning  which was performed on 03/31/2008 confirmed the presence of enhancing  carcinoma at the 12 o'clock position in the left breast.  On 04/24/2008 the  patient underwent lumpectomy with sentinel lymph node biopsy and left  axillary dissection.  Micrometastatic carcinoma was found within one sentinel  lymph node.  Eight other regional lymph nodes were negative for evidence of  metastatic disease.  Within the left breast lumpectomy a 3 x 2.5 mm residuum  of infiltrating carcinoma was found to be present.  Margins of excision were  negative.  The tumor was  intermediate grade.  Per AJCC staging the patient  has a T1a, N76mi.  The patient received Cytoxan and Taxotere every three weeks  for four cycles.  She will be receiving radiation therapy to the whole left  breast as part of breast conservation therapy.  Given that there was only  micrometastatic disease found within one sentinel lymph node and eight other  lymph nodes were all negative for evidence of metastatic disease the  supraclavicular region will not be treated.  Today's dictation  is in regards  to the electron boost to the surgical bed in the left breast.     TREATMENT PLAN:  The patient will receive 5000 cGy delivered in 25 fractions  of 200 cGy each to the whole left breast.  Following this a boost of 1000 cGy  will be delivered in 5 fractions of 200 cGy each to the surgical bed within  the left breast.  Three dimensional CRT treatment planning is being employed.     DETAILS OF SIMULATION:  The previously obtained CT scan of the patient in the  treatment position was used to plan the boost to the surgical bed.  The  surgical bed was contoured as the CTV and expanded by 2 cm to create a PTV.  Gantry angle and couch rotation were optimized to present to the patient's  skin surface in as parallel a fashion as possible to the treating electron  beam, 12 MeV electrons prescribed to the 90% depth dose provided the optimal  isodose distribution.  One custom block will be employed.  The outlines of  this block were created by using a skin surface projection of the volume of  the PTV onto the skin surface with the couch rotation and gantry angle in  treatment position.  The final isodose distribution was examined and found to  acceptably encompass the area of concern.  A dose volume histogram was  examined and it was determined that the dose to the heart and left lung were  kept well within tolerance limits.  One beam calculation will be performed  and one custom block will be employed.     CONSENT:  The technique,  rational, expectations and potential side effects of  radiation therapy have been discussed with the patient and she has had a  chance to ask questions and to have these answered.  She wishes to move  forward with treatment and consent for treatment has been obtained.                                                                _______________________________________  CM/nen                                 _____  D:  08/27/2008 13:13                   Marciano Sequin, M.D.  T:  08/28/2008 00:58  Job #:  [id number]                                 RADIATION ONCOLOGY SIMULATION NOTE                                                               PAGE    1 of   1    Note: An exclamation mark (!) indicates a result that was not dispersed into   the flowsheet.  Document Creation Date:  08/28/2008 1:06 AM  _______________________________________________________________________    (1) Order result status: Final  Collection or observation date-time: 08/27/2008 00:00  Requested date-time:   Receipt date-time:   Reported date-time:   Referring Physician: Marciano Sequin  Ordering Physician:  Reviewed In Hospital [facility])  Specimen Source:   Source: DBS  Filler Order Number: [id number]  Lab site:

## 2008-10-02 NOTE — Unmapped (Signed)
Signed by Edrick [place] MD on 10/04/2008 at 23:28:46      Reason for Visit   Chief Complaint: fup u/s right shoulder had done in november    History from: patient    Allergies  ! * LATEX TAPE    Medications       B COMPLEX   TABS (B COMPLEX VITAMINS) daily  VITAMIN D   TABS (CHOLECALCIFEROL TABS) daily  CALCIUM   TABS (CALCIUM TABS) daily  DULCOLAX 5 MG  TBEC (BISACODYL) Take one a day for constipation as needed.  ALLEGRA-D 24 HOUR 180-240 MG TB24 (FEXOFENADINE-PSEUDOEPHEDRINE) Take one tablet by mouth daily for allergies  ARIMIDEX 1 MG TABS (ANASTROZOLE) daily        Vital Signs:   Wt: 152 lbs.      BMI: 26.19  BSA: 1.74  Wt chg (lbs): -7  Pulse: 70 (regular)  BP: 130/70    Intake recorded by: Vista Lawman on October 02, 2008 10:02 AM    History of Present Illness   Chief Complaint: R shoulder fatty tumor  Had an [place] in 11/09.  Breast cancer is now under control and she would like to readdress.  Mass hasn't seemed to change.  No pain. No redness or functional impairment.  She only knows that it is there is she touches it.  She is worried that it may be cancer.    PAST HISTORY  Past Medical History:  spinal stenosis, Primary Breast cancer, IBS, CTS-left hand  Social History (reviewed - no changes required): Preferred Language: English,   Marital Status: married,   Employment Status: employed full-time,   Occupation: Photographer  Alcohol Use: none  Tobacco Usage:non-smoker        Physical Examination:   BP: 130/  70    Physical Exam- Detail:   General Appearance: Well cared for female, comfortable and pleasant  Skin: No suspicious rashes or lesions.  Respiratory: Respiration un-labored.  Lung fields clear to auscultation.  No wheezing, rales, rhonchi or pleural rub.  Neck: No thyromegaly.  No nodules, masses or tenderness.  Lymphatic: Areas palpated not enlarged:  cervical, supraclavicular.  Cardiac: Nl precordium, S1S2 RRR no ectopy  Psychiatric: Judgement and insight are within normal limits.  Alert and  oriented x3.  No mood disorders noted, appropriate affect.  Musculoskeletal: 5 cm diameter well circumscribed soft mass atop R shoulder, no erythema, calor.  FROM R shoulder.       Comments: R shoulder [place]- No abnl mass or tissue found.        New Problems:  SCREENING, DIABETES MELLITUS (ICD-V77.1)  New Medications:  ARIMIDEX 1 MG TABS (ANASTROZOLE) daily      Preventive Maintenance   Pap Smear Date: 09/30/2008   Mammogram Date: 07/22/2008     Coordinating Care Providers   PCP Name: [facility]  OB/GYN: Dr. Charlett Blake  Oncologist: Dr. Magdalene River  Radiation Oncologist: Dr. Marciano Sequin  Other: Dr. Kalman Drape, Breast surgeon              Assessment and Plan   1. Fatty tumor r shoulder- Con't surveillance.     Problems New Problems:  Dx of SCREENING, DIABETES MELLITUS (ICD-V77.1)  Onset: 10/02/2008    Medications   Today's Orders   99213 - Ofc Vst, Est Level III [ZOX-09604]  Comp Metabolic Panel  (METAPNL) (10231) [*CPT-80053]  Lipid Panel w/Reflex to Direct LDL (14852) [CPT-80061]  Hemoglobin  A1C   (GLYCO) (496)  [CPT-83036]  Disposition:   Return to clinic for Doctor Visit in 3 month(s)   Appointment Reason: CPE

## 2008-11-17 NOTE — Unmapped (Signed)
Signed by Edrick Butler MD on 11/17/2008 at 00:00:00  Hematology/Oncology      Imported By: Scharlene Corn 11/24/2008 10:37:04    _____________________________________________________________________    External Attachment:    Please see [employer] EMR for this document.

## 2008-12-18 NOTE — Unmapped (Signed)
Signed by Edrick [place] MD on 12/18/2008 at 00:00:00  Privacy Notice      Imported By: Scharlene Corn 12/23/2008 07:48:07    _____________________________________________________________________    External Attachment:    Please see [employer] EMR for this document.

## 2008-12-18 NOTE — Unmapped (Signed)
Signed by Edrick [place] MD on 12/18/2008 at 00:00:00  Disclosure      Imported By: Scharlene Corn 12/23/2008 07:48:16    _____________________________________________________________________    External Attachment:    Please see [employer] EMR for this document.

## 2008-12-19 NOTE — Unmapped (Signed)
Signed by Ronn Melena MD on 12/19/2008 at 10:18:14      Reason for Visit   Chief Complaint: fungal infection under arms  going on since 06-2007  tried otc medicines but none have cleared up the problem, would like something strong to get rid of the fungus/ dermatologist prescribed tretinoin but isn't helping     History from: patient    Allergies  ! * LATEX TAPE  Allergy and adverse reaction list reviewed during this update.      Medications   B COMPLEX   TABS (B COMPLEX VITAMINS) daily  VITAMIN D   TABS (CHOLECALCIFEROL TABS) daily  CALCIUM   TABS (CALCIUM TABS) daily  DULCOLAX 5 MG  TBEC (BISACODYL) Take one a day for constipation as needed.  ALLEGRA-D 24 HOUR 180-240 MG TB24 (FEXOFENADINE-PSEUDOEPHEDRINE) Take one tablet by mouth daily for allergies  ARIMIDEX 1 MG TABS (ANASTROZOLE) daily       Vital Signs:   Wt: 148.9 lbs.      BMI: 25.65  BSA: 1.73  Wt chg (lbs): -3.10  Pulse: 76 (regular)  BP: 126/76  Cuff size: regular    Intake recorded by: Delene Ruffini MA on December 19, 2008 9:32 AM      History of Present Illness: has infection under arm  thinks it is fungal  ongoing for months  has had many treatments         Physical Examination:   BP: 126/  76    Physical Exam- Detail:   Skin: right axilla has diffuse red and puffy skin  not hot nor tnder  no sat lesions  no nodes  no ulcers nor vesicles  Lymphatic: Areas palpated not enlarged:  cervical, supraclavicular.           New Medications:  TRIAMCINOLONE ACETONIDE 0.5 % CREA (TRIAMCINOLONE ACETONIDE) use under arm twice a day      Preventive Maintenance       Coordinating Care Providers   PCP Name: [facility]  OB/GYN: Dr. Charlett Blake  Oncologist: Dr. Magdalene River  Radiation Oncologist: Dr. Marciano Sequin  Other: Dr. Kalman Drape, Breast surgeon            Prescriptions:  TRIAMCINOLONE ACETONIDE 0.5 % CREA (TRIAMCINOLONE ACETONIDE) use under arm twice a day  #85 x 0   Entered and Authorized by: Ronn Melena MD   Signed by: Ronn Melena  MD on 12/19/2008   Method used: Print then Give to Patient   RxID: [id number]      Assessment and Plan   Greater than 50% of time spent counseling this patient during an extended visit for one or more medical problems.  76m  rev prev biopsy rewsults and r/u fungal infections  pt nonetheless requesting antifungals.    I agree she may use OTC antifungals but I give her TAC .5% to trial again. Rev side effects again    Medications   New Prescriptions/Refills:  TRIAMCINOLONE ACETONIDE 0.5 % CREA (TRIAMCINOLONE ACETONIDE) use under arm twice a day  #85 x 0, 12/19/2008, Ronn Melena MD    Today's Orders   [phone] - Ofc Vst, Est Level III [phone]  Dermatology Consult [UCP-11111]

## 2009-01-20 LAB — COMPREHENSIVE METABOLIC PANEL
A/G Ratio: 1.5 (ref 1.0–2.1)
ALT: 15 U/L (ref 6–40)
AST: 19 U/L (ref 10–35)
Albumin: 4.4 g/dL (ref 3.6–5.1)
Alkaline Phosphatase: 76 U/L (ref 33–130)
BUN: 14 mg/dL (ref 7–25)
CO2: 22 mmol/L (ref 21–33)
Calcium: 9.4 mg/dL (ref 8.6–10.2)
Chloride: 105 mmol/L (ref 98–110)
Creatinine: 0.88 mg/dL (ref 0.60–1.10)
GFR MDRD Af Amer: >60 mL/min (ref >=60)
GFR MDRD Non Af Amer: >60 mL/min (ref >=60)
Globulin, Total: 2.9 g/dL (ref 2.2–3.9)
Glucose: 83 mg/dL (ref 65–99)
Potassium: 4.4 mmol/L (ref 3.5–5.3)
Sodium: 137 mmol/L (ref 135–146)
Total Bilirubin: 0.5 mg/dL (ref 0.2–1.2)
Total Protein: 7.3 g/dL (ref 6.2–8.3)

## 2009-01-20 LAB — LIPID PANEL
Chol/HDL Ratio: 2.7 (ref <=5.0)
Cholesterol, Total: 180 mg/dL (ref 125–200)
HDL: 67 mg/dL (ref >=46)
LDL Cholesterol: 98 mg/dL (ref <130)
Triglycerides: 75 mg/dL (ref <150)

## 2009-01-20 NOTE — Unmapped (Signed)
Signed by Edrick Franklin MD on 01/24/2009 at 10:13:01  Patient: Amy Mcmillan  Note: All result statuses are Final unless otherwise noted.    Tests: (1) COMPREHENSIVE METABOLIC PANEL W/EGFR (QDL-10231)    GLUCOSE                   83 mg/dL                    13-08                        Fasting reference interval           UREA NITROGEN (BUN)       14 mg/dL                    6-57    CREATININE                0.88 mg/dL                  0.60-1.10   eGFR NON-AFR. AMERICAN                              >60 mL/min/1.60m2           > OR = 60   eGFR AFRICAN AMERICAN                              >60 mL/min/1.52m2           > OR = 60   BUN/CREATININE RATIO (calc)                              NOT APPLICABLE              6-22             Bun/Creatinine ratio is not reported when the BUN      and creatinine values are within normal limits.           SODIUM                    137 mmol/L                  135-146    POTASSIUM                 4.4 mmol/L                  3.5-5.3    CHLORIDE                  105 mmol/L                  98-110    CARBON DIOXIDE            22 mmol/L                   21-33    CALCIUM                   9.4 mg/dL                   8.4-69.6    PROTEIN, TOTAL            7.3 g/dL  6.2-8.3    ALBUMIN                   4.4 g/dL                    1.6-1.0    GLOBULIN (calc)           2.9 g/dL                    9.6-0.4   ALBUMIN/GLOBULIN RATIO (calc)                              1.5                         1.0-2.1    BILIRUBIN, TOTAL          0.5 mg/dL                   5.4-0.9    ALKALINE PHOSPHATASE      76 U/L                      33-130    AST                       19 U/L                      10-35    ALT                       15 U/L                      6-40            REPORT COMMENT:      FASTING    Note: An exclamation mark (!) indicates a result that was not dispersed into   the flowsheet.  Document Creation Date: 01/20/2009 10:14  PM  _______________________________________________________________________    (1) Order result status: Final  Collection or observation date-time: 01/20/2009 10:03  Requested date-time:   Receipt date-time: 01/20/2009 10:04  Reported date-time: 01/20/2009 21:00  Referring Physician:    Ordering Physician: Edrick [place] (BOLONS)  Specimen Source: S  Source: Arline Asp Order Number: [id number]  Lab site: [facility] [place]      [address]      [place]  [place] [address] [facility]  [address]      -----------------    The following non-numeric lab results were dispersed to  the flowsheet even though numeric results were expected:      BUN/CREATININE RATIO (calc), NOT APPLICABLE

## 2009-01-20 NOTE — Unmapped (Signed)
Signed by Edrick Bovey MD on 01/24/2009 at 10:13:01  Patient: Amy Mcmillan  Note: All result statuses are Final unless otherwise noted.    Tests: (1) LIPID PANEL WITH REFLEX TO DIRECT LDL (GNF-62130)    TRIGLYCERIDES             75 mg/dL                    <865    CHOLESTEROL, TOTAL        180 mg/dL                   784-696    HDL CHOLESTEROL           67 mg/dL                    > OR = 46   LDL-CHOLESTEROL (calc)                              98 mg/dL                    <295             Desirable range <100 mg/dL for patients with CHD or      diabetes and <70 mg/dL for diabetic patients with      known heart disease.          CHOL/HDLC RATIO (calc)                              2.7                         < OR = 5.0    Note: An exclamation mark (!) indicates a result that was not dispersed into   the flowsheet.  Document Creation Date: 01/20/2009 10:14 PM  _______________________________________________________________________    (1) Order result status: Final  Collection or observation date-time: 01/20/2009 10:03  Requested date-time:   Receipt date-time: 01/20/2009 10:04  Reported date-time: 01/20/2009 21:00  Referring Physician:    Ordering Physician: Edrick [place] (BOLONS)  Specimen Source: S  Source: Arline Asp Order Number: [id number]  Lab site: [facility] [place]      [address]      [place]  [place]  [address]

## 2009-01-24 NOTE — Unmapped (Signed)
Signed by Edrick Coleta MD on 01/24/2009 at 10:14:31                   [facility]         [facility]         [address]         [place], [place]  [address]         p [phone] f [phone]                     Edrick [place], MD, MPH  [facility]  [address]  [place], [place] [address]  Phone: [phone]  Fax: [phone]      January 24, 2009      Amy Mcmillan  [address]    [place], [place] [address]                                                                                           RE:  TEST RESULTS   [name]--March 14, 1951)         Dear Ms. [name]:      The following is an interpretation of your most recent tests.  Please take note of any instructions provided.  Electrolyte studies:  Normal         Glucose test normal:  83   Kidney function studies:  Normal    Liver function studies:  Normal    Lipid panel:  Normal          Triglyceride: 75   Cholesterol: 180   LDL: 98   HDL: 67   Chol/HDL%:  2.7       Additional Comments: All of your blood tests are normal. Great news.  Please call me if you would like to disucss them further.           Thank you,             Sincerely,         Edrick [place], M.D. MPH

## 2009-02-05 NOTE — Unmapped (Signed)
Signed by Vista Lawman on 02/05/2009 at 10:14:12              February 05, 2009      [place]  [address]  [place], [place]  [address]    [facility]  [address]  [place], [place] [address]  [phone]      Dear  Ms. Laqueta Jean,  Your Provider had previsously given you an order for testing to be done or for a consultation with another Provider. During a review of your records it appears that you have not had this completed.  At your next visit please let us know if you had the test performed or if you have chosen not to have it done.  If you have had the testing completed please disregard this letter.  (Hemoglobin A1C)    [facility]

## 2009-02-24 LAB — HEMOGLOBIN A1C: Hemoglobin A1C: 5.6 % (ref <5.7)

## 2009-02-24 NOTE — Unmapped (Signed)
Signed by Edrick Stapleton MD on 02/25/2009 at 14:32:48  Patient: Amy Mcmillan  Note: All result statuses are Final unless otherwise noted.    Tests: (1) HEMOGLOBIN A1c (QDL-496)   HEMOGLOBIN A1c (% of total Hgb)                              5.6 %                       <5.7                      Decreased risk of diabetes                       <5.7       Decreased risk of diabetes                       5.7-6.0    Increased risk of diabetes                       6.1-6.4    Higher risk of diabetes                       > or = 6.5 Consistent with diabetes                         Standards of Medical Care in Diabetes-2010.                  Diabetes Care, 33(Supp 1): M5-H84,6962.    Note: An exclamation mark (!) indicates a result that was not dispersed into   the flowsheet.  Document Creation Date: 02/24/2009 10:58 PM  _______________________________________________________________________    (1) Order result status: Final  Collection or observation date-time: 02/24/2009 10:39  Requested date-time:   Receipt date-time: 02/24/2009 10:39  Reported date-time: 02/24/2009 22:00  Referring Physician:    Ordering Physician: Edrick Shidler (BOLONS)  Specimen Source: B  Source: Arline Asp Order Number: [id number]  Lab site: [facility] [place]      [address]      [place]    [address]

## 2009-02-25 NOTE — Unmapped (Signed)
Signed by Edrick [place] MD on 02/25/2009 at 14:34:48                   [facility]         [facility]         [address]         [place], [place]  [address]         p [phone] f [phone]                     Edrick [place], MD, MPH  [facility]  [address]  [place], [place] [address]  Phone: [phone]  Fax: [phone]      February 25, 2009      HEYDI SWANGO  [address]    [place], [place] [address]                                                                                           RE:  TEST RESULTS   EDMUNDSON Binns--November 20, 1950)         Dear Ms. Pillay:      The following is an interpretation of your most recent tests.  Please take note of any instructions provided.   Diabetic studies:  Normal - Your sugar levels have been at the upper limits of normal over the past 3 months.       HgbA1c: 5.6          Additional Comments: Your sugar levels are looking good.  Keep up your efforts with diet and exercise.  I look forward to seeing you soon.           Thank you,               HPI Seizure     Sincerely,         Edrick [place], M.D. MPH

## 2009-02-26 NOTE — Unmapped (Signed)
Signed by Edrick [place] MD on 02/26/2009 at 00:00:00  EKG      Imported By: Scharlene Corn 03/08/2009 14:59:34    _____________________________________________________________________    External Attachment:    Please see [employer] EMR for this document.

## 2009-02-26 NOTE — Unmapped (Signed)
Signed by Edrick [place] MD on 03/04/2009 at 19:57:58      Reason for Visit   Chief Complaint: CPE    History from: patient    Allergies  ! * LATEX TAPE    Medications   B COMPLEX   TABS (B COMPLEX VITAMINS) daily  VITAMIN D   TABS (CHOLECALCIFEROL TABS) daily  CALCIUM   TABS (CALCIUM TABS) daily  DULCOLAX 5 MG  TBEC (BISACODYL) Take one a day for constipation as needed.  ALLEGRA-D 24 HOUR 180-240 MG TB24 (FEXOFENADINE-PSEUDOEPHEDRINE) Take one tablet by mouth daily for allergies  ARIMIDEX 1 MG TABS (ANASTROZOLE) daily  TRIAMCINOLONE ACETONIDE 0.5 % CREA (TRIAMCINOLONE ACETONIDE) use under arm twice a day        Vital Signs:   Ht: 63.75 in.  Wt: 152 lbs.      BMI: 26.39  BSA: 1.74  Wt chg (lbs): 3.10  Temperature: 98.0  degrees F  oral  Pulse: 76 (regular)    Patient appears to be in acute distress: no  BP: 122/80  Cuff size: regular  Barriers to Care/Communication: None    Intake recorded by: Sonia Side MA on February 26, 2009 8:20 AM    History of Present Illness   Chief Complaint: CPE  1. Breast cancer- Using arimidex in AM. Having daily diarrhea, watery stool.  Only taking a little food with arimidex.  Used imodium yesterday and this was helpful. Manageable hot flashes.  Does occassionally have constipation.  Eating fiber helps regulate.  2. Hyperchol- Has improved diet, increasing fiber.    3. Uterine prolapse- Feels prolapse when going to the bathroom.  Doesn't interfere with intercourse. Has urge urinary incontinence. Despite diarrhea, has no stool incontinence.    4. Spinal stenosis- Diagnosed 4-5 years ago.  Has continual L LE numbness and tingling.  Has treated supportively with exercise, nighttime positioning, supportive footwear.  She is managing well.  Never took the prescribed neurontin.  Aleve is helpful when she is very physically active and is sore.    5. Candidiasis- Has resolved with daily use of lamisil and cortizone for itching which occurs only rarely.  Has used consistantly for a few  months.  When she has stopped in the past, the rash has returned.  Using sensitive skin deodorant.    6. Interested in preventive screening.  Requests ECG.  Concerned about family history.    PAST HISTORY  Past Medical History (reviewed - no changes required):  spinal stenosis, Primary Breast cancer, IBS, CTS-left hand  Family History: DENIES FAMILY HX MM  Mother- deceased from colon cancer in 66  Father- deceased, HTN, prostate cancer  Brother- prostate cancer  Brother (2)- esophageal constriction  Sister- colon polyp  Sister (2)- TAH  Mother - deceased, DM, uterine cancer  Cousin - colon cancer  Social History: Preferred Language: English,   Marital Status: married,   Employment Status: employed full-time,   Occupation: Photographer  Exercise: walks daily,   Alcohol Use: none  Tobacco Usage:non-smoker    Review of Systems  General: Denies insomnia.   Eyes: Denies vision loss, eye pain, redness.   Ears/Nose/Throat: Denies decreased hearing, dysphagia, sinusitis, tinnitus.   Cardiovascular: Denies chest pains, dizziness, dyspnea on exertion, dyspnea at rest, palpitations, peripheral edema, syncope.   Respiratory: Denies dyspnea, tachypnea, excessive sputum, hemoptysis, wheezing, productive cough.   Gastrointestinal: Denies nausea, vomiting, melena, hematochezia, heartburn/indigestion, regurgitation/reflux.   Genitourinary: Denies vaginal discharge, incontinence, day time wetting, dysuria, hematuria, urinary frequency, amenorrhea, menorrhagia,  abnormal vaginal bleeding.   Musculoskeletal: Denies joint pain, joint swelling, joint redness.   Skin: Denies rash, suspicious lesions.   Neurologic: Denies headache, weakness, paresthesias.   Heme/Lymphatic: Denies abnormal bruising, bleeding.       Physical Examination:   BP: 122/  80    Physical Exam- Detail:   General Appearance: Well cared for female, comfortable and pleasant  Eyes: Sclera white, conjunctiva without injection and pallor.  PERRLA.  EOMI. nl  fundi  Ears: No lesions.  Tympanic membranes translucent, non-bulging.  Canal walls pink, without discharge.  Hearing grossly intact.  Nose/Face: Mucosa and turbinates pink, septum midline. No polyps, no discharge, no lesions.  Oropharynx: Normal appearance.  No erythema, exudate or mass. No tonsillar swelling.  Oral Cavity: Gums pink, good dentition.  Oral mucosa and tongue without lesions.  Respiratory: Respiration un-labored.  Lung fields clear to auscultation.  No wheezing, rales, rhonchi or pleural rub.  Neck: No thyromegaly.  No nodules, masses or tenderness.  Lymphatic: Areas palpated not enlarged:  cervical, supraclavicular.  Cardiac: Nl precordium, S1S2 RRR no ectopy  Vascular: 2+ peripheral pulses, no LE edema  Abdomen: No masses or tenderness. Bowel sounds active x4 quad.  Liver and spleen are without tenderness or enlargement.    Neurologic: Cranial nerves 2 through 12 intact.   Sensation intact.  No spasticity.  Strength is 5/5 in upper and lower extremities bilaterally.  Psychiatric: Judgement and insight are within normal limits.  Alert and oriented x3.  No mood disorders noted, appropriate affect.  Musculoskeletal: 5 cm diameter well circumscribed soft mass atop R shoulder, no erythema, calor.  FROM R shoulder.       In office Procedures & Tests     EKG Interpretation:   Rate: 69  Rhythm: normal sinus rhythm  Axis: normal  ST Segments: no abnormalities  Q Waves: none  Conduction: normal intervals  EKG Comments: normal study    Labs/Tests Reviewed  HgbA1C 5.6 (02/24/2009 10:39:00 AM)  Serum Glucose: 83 (01/20/2009 10:03:00 AM)  Total Protein: 7.3 (01/20/2009 10:03:00 AM)  Albumin: 4.4 (01/20/2009 10:03:00 AM)  AST: 19 (01/20/2009 10:03:00 AM)  ALT: 15 (01/20/2009 10:03:00 AM)  Alkaline Phosphatase: 76 (01/20/2009 10:03:00 AM)  Total Bilirubin: 0.5 (01/20/2009 10:03:00 AM)  Serum Cholesterol 180 (01/20/2009 10:03:00 AM)  LDL: 98 (01/20/2009 10:03:00 AM)  HDL: 67 (01/20/2009 10:03:00 AM)  Triglyceride:  75 (01/20/2009 10:03:00 AM)  Sodium: 137 (01/20/2009 10:03:00 AM)  Potassium: 4.4 (01/20/2009 10:03:00 AM)  Chloride: 105 (01/20/2009 10:03:00 AM)  BUN: 14 (01/20/2009 10:03:00 AM)  Creatinine: 0.88 (01/20/2009 10:03:00 AM)  Serum Calcium: 9.4 (01/20/2009 10:03:00 AM)       New Problems:  DIARRHEA (ICD-787.91)      Preventive Maintenance     Colonoscopy Test Date: 03/24/2009 Pap Smear Date: 2010 Pap Smear Normal   Mammogram Date: 01/2009 Mammogram: Normal Smoking Status: non-smoker      Coordinating Care Providers   PCP Name: [facility]  OB/GYN: Dr. Charlett Blake  Oncologist: Dr. Magdalene River  Radiation Oncologist: Dr. Marciano Sequin  Other: Dr. Kalman Drape, Breast surgeon              Assessment and Plan   1. Diarrhea- Supportive therapy, including imodium and fiber.  Likely ADE.   2. Preventive care- UTD on screening.  Reviewed labs. ECG nl. Con't exercise and diet. Con't Ca and vit D.  Age appropriate counseling provided.    Problems New Problems:  Dx of DIARRHEA (ICD-787.91)  Onset: 02/26/2009  Today's Orders   99396 - Preventive, Est, 40-64 yr [CPT-99396]  EKG w/ Interpretation [CPT-93000]    Disposition:   as needed

## 2009-03-24 NOTE — Unmapped (Signed)
Signed by Edrick Rosamond MD on 03/24/2009 at 00:00:00  Colonoscopy      Imported By: Scharlene Corn 04/14/2009 09:47:56    _____________________________________________________________________    External Attachment:    Please see [employer] EMR for this document.

## 2009-04-27 NOTE — Unmapped (Signed)
Signed by   LinkLogic on 04/28/2009 at 01:49:50  Patient: Amy Mcmillan  Note: All result statuses are Final unless otherwise noted.    Tests: (1)  (MR)    Order Note:                                 [facility]                              [facility]                         RADIATION ONCOLOGY PROGRESS NOTE     Amy Mcmillan.                               DATE SEEN:  04/27/2009  DOB:  04-Aug-1950                                MRN:  [id number]                                                  ACCT:        *-*-*  RADIATION ONCOLOGY PROGRESS NOTE  DATE OF CLINIC VISIT:  04/27/2009     DIAGNOSIS:     1.  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    Status post lumpectomy with sentinel lymph node biopsy/axillary dissection    on 04/24/2008.  Status post Cytoxan and Taxotere every 3 weeks x4 cycles.    Status post 5000 cGy delivered to the whole left breast followed by a boost    of 1000 cGy delivered to the surgical bed within the left breast.    Radiation treatments were completed on 09/25/2008.     PRETREATMENT CLINICAL HISTORY:  The patient is a very pleasant, 59 year old  lady who was found to have a mass lesion in her left breast on routine  mammogram performed at [facility] in November of 2009.  A 10 mm mass  with irregular margins was noted to be present.  The presence of an abnormal  mass was confirmed with the use of ultrasound.  A core needle biopsy was  performed on 03/17/2008 and this diagnosed low grade invasive ductal  carcinoma.  The tumor was ER positive, PR equivocal and HER-2/neu negative.  MRI scanning, which was performed on 03/31/2008, confirmed the presence of  enhancing carcinoma at the 12 o'clock position in the left breast.  On  04/24/2008, the patient underwent lumpectomy with sentinel lymph node biopsy  and left axillary dissection.  Micro-metastatic carcinoma was found within  one sentinel lymph node.  Eight other regional lymph nodes  were negative for  evidence of metastatic disease.  Once in the left breast proper, a 3 x 2.5 mm  residuum of infiltrating carcinoma was found to be present.  Margins of  excision were negative.  The tumor was intermediate grade.  Per AJCC staging,  the patient has a T1a, N1, M1.  The patient received Cytoxan and Taxotere  every three weeks for four  cycles.  The patient received adjuvant radiation  therapy to the left breast and left breast surgical bed completed 09/25/2008.     INTERVAL HISTORY:  The patient returns to clinic today for assessment of  potential late radiation effects and review of restaging scans.  Since she  was last seen in clinic she has not had any complaints of nipple discharge,  nipple retraction, spreading red color on the breasts, dimpling or orange  peel appearance of the skin of the breasts or any new masses palpable within  either breast or under her arms.  She has not had any problems with left arm  swelling.  She is currently on Arimidex under the direction of Dr. Selena Batten.     REVIEW OF SYSTEMS:  The patient's energy and appetite are at baseline.  Breasts and extremity symptoms as above.  The rest of the 10 system review is  negative.     MEDICATIONS:     1.  Allegra 180 mg daily.  2.  Vitamin Mcmillan daily.  3.  B complex daily.  4.  Vitamin B12.  5.  Vitamin E.  6.  Calcium supplementation.  7.  Dulcolax or MiraLax prn.  8.  Arimidex.     ALLERGIES:     2.  Latex.  3.  Adhesive tape.  4.  Topical iodine.  5.  ChloraPrep.  6.  Phenylenediamine.     PAST MEDICAL HISTORY:     1.  Breast cancer, as above.  2.  Allergies.     PAST SURGICAL HISTORY:     1.  Three biopsies of the right breast, all benign, the last one was    performed in 2004.  2.  Bartholin cyst removed on 07/22/1999.  3.  Bilateral bunionectomies.     PAST RADIATION HISTORY:  Status post 5000 cGy delivered to the whole left  breast followed by a boost to the surgical bed within the left breast of 1000  cGy.  The patient received a  total dose of 6000 cGy delivered at [facility].     FAMILY HISTORY:  The patient's mother, unfortunately died of colon cancer.  The patient has a maternal aunt who is being treated for breast cancer.  The  patient's father had prostate cancer.     GYNECOLOGIC HISTORY:  The patient is G1, P1 and she had her daughter at age  65.  She began menstruating at age 60.  She was perimenopausal with  diagnosis.  She used birth control pills from age 86 to 58.  She never used  hormone replacement therapy.     SOCIAL HISTORY:  The patient is married and she lives in [place]. Healthy.  She  works for [employer] in [place], [place].  She does not use alcohol or  tobacco products.     PHYSICAL EXAMINATION:     GENERAL:  Alert and oriented, appears her stated age, well-nourished,  well-developed and in no acute distress.  WEIGHT:  156 pounds.  HEAD & NECK:  PERRLA.  EOMI.  Oral mucosa moist.  Oral cavity unremarkable.  Neck is supple.  No JVD.  No thyromegaly.  PULMONARY:  Clear to auscultation bilaterally.  No rales, rhonchi or wheezes.  CARDIAC EXAM:  Regular rate and rhythm, S1 and S2 heart sounds present.  No  murmurs, rubs or gallops appreciated.  ABDOMEN:  Soft, nontender and nondistended.  Bowel sounds present.  BREAST EXAM:  Bilaterally, the contours of the  breasts are smooth and  symmetric.  There is no nipple discharge, nipple retraction, suspicious  erythema or peau Mcmillan'orange appearance of the skin of either breast.  I do not  palpable any suspicious masses bilaterally within the parenchyma of either  breast.  There are three surgical scars in the right breast.  One in the  upper inner quadrant and two in the upper outer quadrants.  All of these are  smooth and unremarkable by inspection and palpation.  On the left breast,  there is a surgical scar in the superior peritoneum-areolar position and one  in the left axilla, both of these are smooth and unremarkable by inspection  and palpation.   There is hyperpigmentation of the left breast consistent with  prior radiation treatment.  LYMPH NODE EXAM:  No lymphadenopathy is palpable bilaterally in the cervical,  supraclavicular or axillary regions.  EXTREMITIES:  No clubbing, cyanosis or edema.  Pulses palpable bilaterally in  the radialis arteries.  MUSCULOSKELETAL:  Unremarkable.  INTEGUMENT:  No rashes or ulcerations.  PSYCHIATRIC:  Unremarkable.  NEUROLOGIC EXAM:  Cranial nerves II through XII grossly intact.  Strength and  light touch intact, and bilaterally symmetric.  Fine motor coordination  grossly normal.  Gait normal.     PATHOLOGY:     1.  Left breast core needle biopsy on 03/18/2008 (case #[id number]):  Low    grade invasive ductal carcinoma arising in the background of extensive    ductal carcinoma in situ, intermediate nuclear grade, with focal comedo    necrosis.  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  The    tumor is ER positive, progesterone receptor equivocal and HER-2/neu    negative.  2.  Left breast lumpectomy with sentinel lymph node biopsy and axillary    dissection on 04/24/2008 (case #[id number]):  Micro-metastatic disease is    present within the sentinel lymph node.  There is residuum of the primary    invasive carcinoma, which is intermediate grade.  The residuum measures 3 x    2.5 mm.   Eight other lymph nodes were dissected from the left axilla and    all of these were negative for metastatic disease.  Margins were negative.    Micro-calcifications were present.  Angiolymphatic invasion was not    identified.  AJCC staging pT1a, pN1, M1.     RADIOGRAPHIC STUDIES:     1.  Diagnostic mammogram with ultrasound on 02/24/2008:  A 10 mm mass with    irregular margins was noted in the central and superior aspect of the left    breast.  Some associated distortion surrounds this mass.  A few faint    calcifications are associated with the mass.  Sonographic examination at    the 12 o'clock position of the left  breast reveals an approximately 9 mm    hypoechoic mass with poorly circumscribed margins and mild shattering.    This is taller than it is wide and is suspicious for primary breast    carcinoma.  2.  MRI scan of the bilateral breasts on 03/31/2008:  A 10 x 11 mm enhancing    irregular mass is demonstrated within the superior left breast, which    exhibits some post biopsy changes.  This is consistent with biopsied    carcinoma.   No other abnormally enhancing foci are demonstrated within the    left breast.  Once in the upper outer quadrant of the right breast, there    is a 3  x 3 mm enhancing nodule, consistent with a small intramammary lymph    node, which is stable on mammography dating back to 2007.  Several small    lymph nodes are appreciated in each axilla.  3.  Bilateral diagnostic mammogram on 01/20/2009:  Postoperative changes in    the left breast from previous lumpectomy.  Decreasing density at the    lumpectomy site consistent with probable resolving hematoma/seroma.  No new    masses or clustered microcalcifications in either breast.  Right breast    similar in appearance to 02/19/2008.  4.  Ultrasound of the right shoulder performed 03/14/2008:  Targeted    sonography of indicated area of interest demonstrated no evidence of    subcutaneous mass or fluid collection.     IMPRESSION:  There is no clinical or radiographic evidence of recurrent  disease or severe late radiation effect.     PLAN:     1.  The patient will continue follow-up with Dr. Selena Batten as previously    scheduled.  2.  The patient will be seeing Dr. Hyacinth Meeker in April and will be having a    mammogram at that time.  Dr. Hyacinth Meeker will continue to follow-up these    mammograms.  3.  I, myself, would like to see the patient back in clinic in one year for    continued assessment of any potential late radiation effects.                             *-*-*                                              _______________________________________  CM/nen                                  _____  Mcmillan:  04/27/2009 14:29                   Marciano Sequin, M.Mcmillan.  T:  04/28/2009 01:34  Job #:  [id number]     c:   Nadyne Coombes. Hyacinth Meeker, M.Mcmillan.          Robert L. Selena Batten, M.Mcmillan.          Fabio Asa, M.Mcmillan.                            RADIATION ONCOLOGY PROGRESS NOTE      PAGE    1 of   1    Note: An exclamation mark (!) indicates a result that was not dispersed into   the flowsheet.  Document Creation Date: 04/28/2009 1:49 AM  _______________________________________________________________________    (1) Order result status: Final  Collection or observation date-time: 04/27/2009 00:00  Requested date-time:   Receipt date-time:   Reported date-time:   Referring Physician: Marciano Sequin  Ordering Physician:  Reviewed In [facility])  Specimen Source:   Source: DBS  Filler Order Number: [id number]  Lab site:

## 2009-05-31 NOTE — Unmapped (Signed)
Signed by Viviann Spare MD on 05/31/2009 at 00:00:00  Hematology/Oncology      Imported By: Scharlene Corn 06/11/2009 09:08:58    _____________________________________________________________________    External Attachment:    Please see [employer] EMR for this document.

## 2009-05-31 NOTE — Unmapped (Signed)
Signed by Theador Hawthorne MD on 05/31/2009 at 00:00:00  Office Visit      Imported By: Scharlene Corn 06/03/2009 14:42:04    _____________________________________________________________________    External Attachment:    Please see [facility] EMR for this document.

## 2009-07-29 ENCOUNTER — Inpatient Hospital Stay

## 2009-07-29 NOTE — Unmapped (Signed)
Signed by Thresa Ross MA on 07/29/2009 at 11:55:58         Follow-up for Test Results:   Comments: Notify pt. 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  Follow-up by: Viviann Spare MD,  July 29, 2009 11:48 AM    Additional Follow-up for Test Results:   Action Taken: Left patient message on answering machine  Additional Follow-up by: Thresa Ross MA,  July 29, 2009 11:55 AM

## 2009-07-29 NOTE — Unmapped (Signed)
Signed by Viviann Spare MD on 07/29/2009 at 11:51:21      Reason for Visit   Chief Complaint: arm pain     History from: patient    Allergies  ! * LATEX TAPE    Medications   B COMPLEX   TABS (B COMPLEX VITAMINS) daily  VITAMIN D   TABS (CHOLECALCIFEROL TABS) daily  CALCIUM   TABS (CALCIUM TABS) daily  DULCOLAX 5 MG  TBEC (BISACODYL) Take one a day for constipation as needed.  ALLEGRA-D 24 HOUR 180-240 MG TB24 (FEXOFENADINE-PSEUDOEPHEDRINE) Take one tablet by mouth daily for allergies  ARIMIDEX 1 MG TABS (ANASTROZOLE) daily  TRIAMCINOLONE ACETONIDE 0.5 % CREA (TRIAMCINOLONE ACETONIDE) use under arm twice a day        Vital Signs:   Wt: 148 lbs.      BMI: 25.70  BSA: 1.72  Wt chg (lbs): -4  Pulse: 68  BP: 134/70    Intake recorded by: Grandville Silos on July 29, 2009 8:25 AM    History of Present Illness   59 y/o F with left arm pain deep in the upper arm which she has had for 1 year on and off.  It has been worse in the last few days which was relieved with aleve.  She can't think of anything that may have brought this on. She exercises regularly. She has a h/o left sided breast cancer diagnosed in 2009. She had a lumpectomy with lymph node dissection 04/2008.  She saw her oncologist in January and saw surgeon 2 weeks ago. She also had a mammogram at that time which was normal.      History of Present Illness: MEDICAL STUDENT NOTE:  58yo woman in with episodic pain deep inside in left arm like a toothache over preceding 1y.  Rated 1/10 to 3/10, dull and constant, nonradiating, no known triggers, no reported injury. One episode 2d ago had onset while cooking, lasted all day but resolved following Aleve.  No impairment of ADL's; the patient claims full function in left arm.  For exercise, she walks 6-8 blocks 5x daily and runs-in-place 3 minuites 7x daily.  She reports normal exercise capacity -- no trouble with stairs or heavy lifting.    PMH significant for breast cancer treated with lumpectomy, chemotherapy,  and radiation, most recently 58m ago.  Repeat mammogram 50m ago was good per patient.  Saw Oncology 36m ago, clean bill of health, next appt 78m from today.  The patient expressed concern this may be a bone met and requested radiography.    FHx significant for CAD in several relatives.    ROS: +weakness, +dizziness, +sinus congestion, +diarrhea unchanged from baseline, +hot flashes.  No fevers, no chills, no headaches, no nausea, no chest discomfort, no palpitations, no dyspnea, no abdominal pain, no dysuria.    PE:  Gen: alert, cooperative, no apparent distress.  Vitals: as above.  Skin: pink, warm, dry.  Head: normocephalic, atraumatic.  Neck: supple, no lymphadenopathy, thyroid normal.  Chest: no lymph nodes palpated in left axilla.  Cardiac: RRR, +S1, +S2, -murmur, -gallop.  Resp: regular, nonlabored, breath sounds clear/equal.  Abd: soft, nontender, no distention, no hepatomegaly, no splenomegaly.  Musk: strength and range of motion 5/5 in wrists, elbows, and shoulders.  Pain not reproducible on palpation of humerus.  Psych: mood fine.    A/P: 58yo woman with episodic left humerus pain concerning for possible bony neoplasm.  Propose plain film of humerus; additionally propose chest radiograph as lung mets  are the most common met site for breast cancer.  Also in the differential is acute coronary syndrome, although this patient's presentation would be atypical.  This route could be investigated by an ECG, labwork, and a treadmill stress test.    PAST HISTORY  Past Medical History (reviewed - no changes required):  spinal stenosis, Primary Breast cancer, IBS, CTS-left hand  Surgical History (reviewed - no changes required):  BTL, breast lumpectomy    Social History (reviewed - no changes required): Preferred Language: English,   Marital Status: married,   Employment Status: employed full-time,   Occupation: Photographer  Exercise: walks daily,   Alcohol Use: none  Tobacco Usage:non-smoker    Review of  Systems  General: Denies fevers, fatigue.   Musculoskeletal: Denies joint pain, muscle weakness, muscle pains, stiffness.   Neurologic: Denies paresthesias.       Physical Examination:   BP: 134/  70    Physical Exam- Detail:   General Appearance: well-developed, well-nourished and in no acute distress.  Skin: No suspicious rashes or lesions.  Respiratory: Respiration un-labored.  Lung fields clear to auscultation.  No wheezing, rales, rhonchi or pleural rub.  Cardiac: S1 and S2 normal.  RRR without murmurs, rubs, gallops.  Vascular: No edema  Left Upper Extremity: Tender with palpation of the humerus, shoulder joint normal, no swelling          Preventive Maintenance       Coordinating Care Providers   PCP Name: [facility]  OB/GYN: Dr. Charlett Blake  Oncologist: Dr. Magdalene River  Radiation Oncologist: Dr. Marciano Sequin  Other: Dr. Kalman Drape, Breast surgeon            Prescriptions:  ALLEGRA-D 24 HOUR 180-240 MG TB24 (FEXOFENADINE-PSEUDOEPHEDRINE) Take one tablet by mouth daily for allergies  #30 x 5   Entered and Authorized by: Viviann Spare MD   Signed by: Viviann Spare MD on 07/29/2009   Method used: Print then Give to Patient   RxID: [id number]      Assessment and Plan   Left arm pain-likely muskuloskeletal, r/o cancer mets with x-ray.  Otherwise Aleve as needed.  Follow-up if no improvement or worsening of symptoms.     Medications   New Prescriptions/Refills:  ALLEGRA-D 24 HOUR 180-240 MG TB24 (FEXOFENADINE-PSEUDOEPHEDRINE) Take one tablet by mouth daily for allergies  #30 x 5, 07/29/2009, Viviann Spare MD    Today's Orders   X-ray, Humerus, 2+ views [CPT-73060]  99213 - Ofc Vst, Est Level III [place].Jean                  ]

## 2009-07-29 NOTE — Unmapped (Signed)
Signed by   LinkLogic on 07/29/2009 at 10:11:24  Patient: Amy Mcmillan  Note: All result statuses are Final unless otherwise noted.    Tests: (1) DIAG-HUMERUS MIN 2-VIEWS LT ([id number])    Order Note: [facility] Accession Number: [id number]    Order Note:                                   [facility]     Patient: Amy Mcmillan, Amy Mcmillan   DOB:     20-Jul-1950   MRN:     [id number]   FIN:       [id number]   Accn#:   [id number]                                  Diagnostic Radiology     Exam                       Exam Date/Time       Ordering Physician   DIAG-HUMERUS MIN 2-VIEWS   07/29/2009 09:46 EDT Naveah Brave, Marnette Burgess   LT     Reason for Exam   pain     Report   Two views left humerus.     History: Pain.     Findings: There is mild degenerative changes of the [facility] joint. The humerus   is intact.     Impression:     Mild degenerative changes at the [facility] joint.   ********** VERIFIED REPORT **********     Dictated: 07/29/2009 10:12 am     [facility] M.D., Barbette Hair   Signed (Electronic Signature):  [facility] M.D., ROBERT D          07/29/09   10:13     Technologist: MS    Order Note:   EMR Routing to: Christin Fudge - ordering - [id number]    Note: An exclamation mark (!) indicates a result that was not dispersed into   the flowsheet.  Document Creation Date: 07/29/2009 10:11 AM  _______________________________________________________________________    (1) Order result status: Final  Collection or observation date-time: 07/29/2009 09:46:14  Requested date-time: 07/29/2009 09:39:00  Receipt date-time:   Reported date-time: 07/29/2009 10:13:10  Referring Physician: Viviann Spare  Ordering Physician: Viviann Spare [facility])  Specimen Source: RAD&Rad Type  Source: QRS  Filler Order Number: [id number]  Lab site: [facility]

## 2009-10-26 NOTE — Unmapped (Signed)
Signed by Viviann Spare MD on 10/26/2009 at 00:00:00  Hematology/Oncology      Imported By: Scharlene Corn 11/04/2009 11:15:23    _____________________________________________________________________    External Attachment:    Please see [facility] EMR for this document.

## 2009-11-23 NOTE — Unmapped (Signed)
Signed by Viviann Spare MD on 11/23/2009 at 00:00:00  Hematology/Oncology      Imported By: Scharlene Corn 11/30/2009 11:27:52    _____________________________________________________________________    External Attachment:    Please see [facility] EMR for this document.

## 2010-01-31 LAB — COMPREHENSIVE METABOLIC PANEL
A/G Ratio: 1.5 (ref 1.0–2.1)
ALT: 18 units/L (ref 6–40)
AST: 19 units/L (ref 10–35)
Albumin: 4.6 g/dL (ref 3.6–5.1)
Alkaline Phosphatase: 94 units/L (ref 33–130)
BUN: 16 mg/dL (ref 7–25)
CO2: 24 mmol/L (ref 21–33)
Calcium: 9.7 mg/dL (ref 8.6–10.2)
Chloride: 107 mmol/L (ref 98–110)
Creatinine: 0.9 mg/dL (ref 0.60–1.10)
GFR MDRD Af Amer: 82 mL/min (ref >=60)
GFR MDRD Non Af Amer: 70 mL/min (ref >=60)
Globulin, Total: 3.1 g/dL (ref 2.2–3.9)
Glucose: 84 mg/dL (ref 65–99)
Potassium: 4.8 mmol/L (ref 3.5–5.3)
Sodium: 141 mmol/L (ref 135–146)
Total Bilirubin: 0.4 mg/dL (ref 0.2–1.2)
Total Protein: 7.7 g/dL (ref 6.2–8.3)

## 2010-01-31 LAB — OFFICE VISIT LAB RESULTS
KOH Prep: NEGATIVE
Trichomonas Wet Prep: ABSENT

## 2010-01-31 LAB — CBC AND DIFFERENTIAL
Basophils Absolute: 22 10*3/uL (ref 0–200)
Basophils Relative: 0.4 %
Eosinophils Absolute: 32 10*3/uL (ref 15–500)
Eosinophils Relative: 0.6 %
Hematocrit: 40 % (ref 35.0–45.0)
Hemoglobin: 13.3 g/dL (ref 11.7–15.5)
Lymphocytes Absolute: 1361 10*3/uL (ref 850–3900)
Lymphocytes Relative: 25.2 %
MCH: 30.9 pg (ref 27.0–33.0)
MCHC: 33.2 g/dL (ref 32.0–36.0)
MCV: 93.2 fL (ref 80.0–100.0)
Monocytes Absolute: 394 10*3/uL (ref 200–950)
Monocytes Relative: 7.3 %
Neutrophils Absolute: 3591 10*3/uL (ref 1500–7800)
Neutrophils Relative: 66.5 %
Platelets: 271 10*3/uL (ref 140–400)
RBC: 4.29 10*6/uL (ref 3.80–5.10)
RDW: 14 % (ref 11.0–15.0)
WBC: 5.4 10*3/uL (ref 3.8–10.8)

## 2010-01-31 LAB — LIPID PANEL
Chol/HDL Ratio: 3.2 (ref <=5.0)
Cholesterol, Total: 190 mg/dL (ref 125–200)
HDL: 59 mg/dL (ref >=46)
LDL Cholesterol: 113 mg/dL (ref <130)
Triglycerides: 88 mg/dL (ref <150)

## 2010-01-31 NOTE — Unmapped (Signed)
Signed by Viviann Spare MD on 01/31/2010 at 10:37:21      Reason for Visit   Chief Complaint: CPE no pap    History from: patient    Allergies  ! * LATEX TAPE  Allergy and adverse reaction list reviewed during this update.      Medications   B COMPLEX   TABS (B COMPLEX VITAMINS) daily  VITAMIN D   TABS (CHOLECALCIFEROL TABS) daily  CALCIUM   TABS (CALCIUM TABS) daily  ALLEGRA-D 24 HOUR 180-240 MG TB24 (FEXOFENADINE-PSEUDOEPHEDRINE) Take one tablet by mouth daily for allergies  ARIMIDEX 1 MG TABS (ANASTROZOLE) daily        Vital Signs:   Ht: 64 in.  Wt: 149 lbs.      BMI: 25.67  BSA: 1.73  Wt chg (lbs): 1  Temperature: 97.4  degrees F  oral  Pulse: 68 (regular)    Patient appears to be in acute distress: no  BP: 120/80  Cuff size: large  Barriers to Care/Communication: None    Intake recorded by: Sonia Side MA on January 31, 2010 9:53 AM    History of Present Illness   She had a sigmoidoscopy 01/19/10 with Dr. Kriste Basque.  She was on Anastrazole which they thought was causing one episode of loose stool. She switched to brand name Arimidex. She was doing okay, no diarrhea until yesterday.  She take it with food yesterday and had diarrhea this morning.  She will be on the Armidex for 4 more years (5 total).    She has lumbar spinal stenosis and has tingling in her legs on and off. This improves with walking.    She has a prolapsed uterus.  She had antibiotcs a few weeks ago.  She had a yeast infection and treated it with an OTC monistat. She no longer has symptoms but has a little discharge with an odor.       History of Present Illness   Chief Complaint: Here for PE    Menses   Still having periods? No    GU Cancer Screening   Uterus: present  Ovaries: present  Past Abnormal Pap: No  Recent Pap Smear: yes  Pap: Normal  Date: 09/08/2009    Breast Cancer Screening   Mass/Discharge: No  Performs BSE: Yes  Hx Breast Biopsy: Yes  PMH Breast CA: Yes  FH Breast CA: No    Osteoporosis   She does not have documented  osteoporosis. There is a family history of osteoporosis. She describes her calcium intake as: Takes a supplement. Her reported exercise is: walks daily.   Exercise: Yes, walks  Comments: Diet: good  Sleep:  good  Safety: 100% seatbelts, no cell phone use in the car, no domestic violence.   Emotions: doing okay    PAST HISTORY  Past Medical History (reviewed - no changes required):  spinal stenosis, Primary Breast cancer, IBS, CTS-left hand  Surgical History (reviewed - no changes required):  BTL, L breast lumpectomy 04/2008    Family History (reviewed - no changes required): DENIES FAMILY HX MM  Mother- deceased from colon cancer in 57  Father- deceased, HTN, prostate cancer  Brother- prostate cancer  Brother (2)- esophageal constriction  Sister- colon polyp  Sister (2)- TAH  Mother - deceased, DM, uterine cancer  Cousin - colon cancer  Social History (reviewed - no changes required): Preferred Language: English,   Marital Status: married,   Employment Status: employed full-time,   Associate Professor: Teacher, adult education.,  Occupation: Engineer, drilling  Exercise: walks daily,   Caffeine per Day: <1  Alcohol Use: none  Drug Use: none  Tobacco Usage:non-smoker    Review of Systems  Refer to HPI for review of systems documentation.      Physical Examination:   BP: 120/  80    Physical Exam- Detail:   General Appearance: well-developed, well-nourished and in no acute distress.  Skin: No suspicious rashes or lesions.  Eyes: Sclera white, conjunctiva without injection and pallor.  PERRLA.  EOMI  Ears: No lesions.  Tympanic membranes translucent, non-bulging.  Canal walls pink, without discharge.  Hearing grossly intact.  Nose/Face: Mucosa and turbinates pink, septum midline. No polyps, no discharge, no lesions.  Oropharynx: Normal appearance.  No erythema, exudate or mass. No tonsillar swelling.  Oral Cavity: Gums pink, good dentition.  Oral mucosa and tongue without lesions.  Respiratory: Respiration un-labored.  Lung  fields clear to auscultation.  No wheezing, rales, rhonchi or pleural rub.  Neck: No thyromegaly.  No nodules, masses or tenderness.  Lymphatic: Areas palpated not enlarged:  cervical, supraclavicular.  Breast: Breasts symmetrical. Surgical scars bilaterally, no lumps.  Cardiac: S1 and S2 normal.  RRR without murmurs, rubs, gallops.  No JVD.  Vascular: No edema or varicosities.  Pedal Pulse Left: dorsalis pedis +2, posterior tib +2  Pedal Pulse Right: dorsalis pedis +2,  posterior tib +2  Abdomen: No masses or tenderness. Bowel sounds active x4 quad.  Liver and spleen are without tenderness or enlargement.  No hernias.  Female G/U: No external masses, lesions, scars, rashes, or swelling of vulva.  Labia, clitoris, vaginal orifice, and urethral meatus intact   Cervix Creamy white discharge, Cervix pink and without lesions  Neurologic: Cranial nerves 2 through 12 intact.  Deep tendon reflexes 2+ bilaterally.  Sensation intact.  No spasticity.  Strength is 5/5 in upper and lower extremities bilaterally.  Psychiatric: Judgement and insight are within normal limits.  Alert and oriented x3.  No mood disorders noted, appropriate affect.      In office Procedures & Tests     Other tests:     Wet Mount/KOH   Source: Vaginal discahrge  WM: no WBCs, Clue Cells absent, Yeast present, Trichomonas absent  KOH: Negative Whiff         New Medications:  VITAMIN D   TABS (CHOLECALCIFEROL TABS) 400 IU daily  VITAMIN E 400 IU CAPS (VITAMIN E) one by mouth daily  B-12  TABS (CYANOCOBALAMIN TABS) 1 tab daily  DIFLUCAN 150 MG TABS (FLUCONAZOLE) one by mouth once  New Allergies:  ! * LATEX TAPE    Preventive Maintenance     Colonoscopy Test Date: 03/10/2009 Colonoscopy: Normal   Performs Breast Self Exams: Yes      Coordinating Care Providers   PCP Name: [facility]  OB/GYN: Dr. Charlett Blake  Oncologist: Dr. Magdalene River  Radiation Oncologist: Dr. Marciano Sequin  Other: Dr. Kalman Drape, Breast surgeon        Tetanus/Td  Immunization History:     Tetanus/Td # 1:  Historical (08/18/2003)    Meningococcal Immunization History:     Meningococcal # 1:  Historical (02/08/1994)        Prescriptions:  DIFLUCAN 150 MG TABS (FLUCONAZOLE) one by mouth once  #1 x 1   Entered and Authorized by: Viviann Spare MD   Signed by: Viviann Spare MD on 01/31/2010   Method used: Print then Give to Patient   RxID: [id number]  Assessment and Plan   1. Routine Health Maintenance-Discussed eating 5 fruits and vegetables a day, 30 minutes of daily exercise, regular sleep and safety concerns. Discussed taking calcium 1200 mg a day plus Vitamin D 1000 IU  a day.   Check fasting labs.  2. Vaginal candidiasis-treat with Diflucan.    Medications   New Prescriptions/Refills:  DIFLUCAN 150 MG TABS (FLUCONAZOLE) one by mouth once  #1 x 1, 01/31/2010, Viviann Spare MD    Today's Orders   Comp Metabolic Panel  (METAPNL) (10231) [*CPT-80053]  Lipid Profile   (FATS) (7600) [CPT-80061]  CBC + plat + Diff  (6399) [*CPT-85025]  [address] - Preventive, Est, 40-64 yr [CPT-99396]  Wet Prep KOH (Office) [CPT-87220]  Wet Prep Saline (Office) [CPT-87210]    Disposition:   Return to clinic for Doctor Visit as needed                 ]

## 2010-01-31 NOTE — Unmapped (Signed)
Signed by Viviann Spare MD on 01/31/2010 at 00:00:00  Hematology/Oncology      Imported By: Scharlene Corn 02/04/2010 10:02:13    _____________________________________________________________________    External Attachment:    Please see [employer] EMR for this document.

## 2010-01-31 NOTE — Unmapped (Signed)
Signed by Viviann Spare MD on 02/01/2010 at 09:03:10  Patient: Amy Mcmillan  Note: All result statuses are Final unless otherwise noted.    Tests: (1) CBC (INCLUDES DIFF/PLT) (QDL-6399)   WHITE BLOOD CELL COUNT                              5.4 Thousand/uL             3.8-10.8    RED BLOOD CELL COUNT      4.29 Million/uL             3.80-5.10    HEMOGLOBIN                13.3 g/dL                   47.8-29.5    HEMATOCRIT                40.0 %                      35.0-45.0    MCV                       93.2 fL                     80.0-100.0    MCH                       30.9 pg                     27.0-33.0    MCHC                      33.2 g/dL                   62.1-30.8    RDW                       14.0 %                      11.0-15.0    PLATELET COUNT            271 Thousand/uL             140-400    ABSOLUTE NEUTROPHILS      3591 cells/uL               1500-7800    ABSOLUTE LYMPHOCYTES      1361 cells/uL               [phone]    ABSOLUTE MONOCYTES        394 cells/uL                200-950    ABSOLUTE EOSINOPHILS      32 cells/uL                 15-500    ABSOLUTE BASOPHILS        22 cells/uL                 0-200    NEUTROPHILS               66.5 %    LYMPHOCYTES  25.2 %    MONOCYTES                 7.3 %    EOSINOPHILS               0.6 %    BASOPHILS                 0.4 %            REPORT COMMENT:      FASTING    Note: An exclamation mark (!) indicates a result that was not dispersed into   the flowsheet.  Document Creation Date: 02/01/2010 12:10 AM  _______________________________________________________________________    (1) Order result status: Final  Collection or observation date-time: 01/31/2010 10:38  Requested date-time:   Receipt date-time: 01/31/2010 10:39  Reported date-time: 02/01/2010 00:00  Referring Physician:    Ordering Physician: Viviann Spare [facility])  Specimen Source: B  Source: Arline Asp Order Number: [id number]  Lab site: [facility] [place]   [address]        [place]  [address]      -----------------    The following lab values were dispersed to the flowsheet  with no units conversion:      WHITE BLOOD CELL COUNT, 5.4 THOUSAND/UL, (F)  expected units: 10*3/mm3    RED BLOOD CELL COUNT, 4.29 MILLION/UL, (F)  expected units: 10*6/mm3    PLATELET COUNT, 271 THOUSAND/UL, (F)  expected units: 10*3/mm3    ABSOLUTE NEUTROPHILS, 3591 CELLS/UL, (F)  expected units: K/uL    ABSOLUTE LYMPHOCYTES, 1361 CELLS/UL, (F)  expected units: 10*3/mm3    ABSOLUTE MONOCYTES, 394 CELLS/UL, (F)  expected units: 10*3/microliter    ABSOLUTE EOSINOPHILS, 32 CELLS/UL, (F)  expected units: 10*3/mm3    ABSOLUTE BASOPHILS, 22 CELLS/UL, (F)  expected units: K/uL

## 2010-01-31 NOTE — Unmapped (Signed)
Signed by Viviann Spare MD on 02/01/2010 at 09:03:10  Patient: Amy Mcmillan  Note: All result statuses are Final unless otherwise noted.    Tests: (1) LIPID PANEL (QDL-7600)    CHOLESTEROL, TOTAL        190 mg/dL                   829-562    HDL CHOLESTEROL           59 mg/dL                    > OR = 46    TRIGLYCERIDES             88 mg/dL                    <130   LDL-CHOLESTEROL (calc)                              113 mg/dL                   <865             Desirable range <100 mg/dL for patients with CHD or      diabetes and <70 mg/dL for diabetic patients with      known heart disease.          CHOL/HDLC RATIO (calc)                              3.2                         < OR = 5.0    Note: An exclamation mark (!) indicates a result that was not dispersed into   the flowsheet.  Document Creation Date: 02/01/2010 12:10 AM  _______________________________________________________________________    (1) Order result status: Final  Collection or observation date-time: 01/31/2010 10:38  Requested date-time:   Receipt date-time: 01/31/2010 10:39  Reported date-time: 02/01/2010 00:00  Referring Physician:    Ordering Physician: Viviann Spare [facility])  Specimen Source: S  Source: Arline Asp Order Number: [id number]  Lab site: [facility] [place]      [address]      [place]  [place]  [address]

## 2010-01-31 NOTE — Unmapped (Signed)
Signed by Viviann Spare MD on 02/01/2010 at 09:03:10  Patient: Amy Mcmillan  Note: All result statuses are Final unless otherwise noted.    Tests: (1) COMPREHENSIVE METABOLIC PANEL (QDL-10231)    GLUCOSE                   84 mg/dL                    16-10                        Fasting reference interval           UREA NITROGEN (BUN)       16 mg/dL                    9-60    CREATININE                0.90 mg/dL                  0.60-1.10   eGFR NON-AFR. AMERICAN                              70 mL/min/1.90m2            > OR = 60   eGFR AFRICAN AMERICAN                              82 mL/min/1.22m2            > OR = 60   BUN/CREATININE RATIO (calc)                              NOT APPLICABLE              6-22    SODIUM                    141 mmol/L                  135-146    POTASSIUM                 4.8 mmol/L                  3.5-5.3    CHLORIDE                  107 mmol/L                  98-110    CARBON DIOXIDE            24 mmol/L                   21-33    CALCIUM                   9.7 mg/dL                   4.5-40.9    PROTEIN, TOTAL            7.7 g/dL                    8.1-1.9    ALBUMIN                   4.6  g/dL                    1.6-1.0    GLOBULIN (calc)           3.1 g/dL                    9.6-0.4   ALBUMIN/GLOBULIN RATIO (calc)                              1.5                         1.0-2.1    BILIRUBIN, TOTAL          0.4 mg/dL                   5.4-0.9    ALKALINE PHOSPHATASE      94 U/L                      33-130    AST                       19 U/L                      10-35    ALT                       18 U/L                      6-40    Note: An exclamation mark (!) indicates a result that was not dispersed into   the flowsheet.  Document Creation Date: 02/01/2010 12:10 AM  _______________________________________________________________________    (1) Order result status: Final  Collection or observation date-time: 01/31/2010 10:38  Requested date-time:   Receipt date-time: 01/31/2010  10:39  Reported date-time: 02/01/2010 00:00  Referring Physician:    Ordering Physician: Viviann Spare [facility])  Specimen Source: S  Source: Arline Asp Order Number: [id number]  Lab site: [facility] [place]      [address]      [place]  [facility]  [address]      -----------------    The following non-numeric lab results were dispersed to  the flowsheet even though numeric results were expected:      BUN/CREATININE RATIO (calc), NOT APPLICABLE

## 2010-01-31 NOTE — Unmapped (Signed)
Signed by Viviann Spare MD on 01/31/2010 at 00:00:00  Disclosure      Imported By: Scharlene Corn 02/02/2010 10:30:17    _____________________________________________________________________    External Attachment:    Please see [employer] EMR for this document.

## 2010-01-31 NOTE — Unmapped (Signed)
Signed by Viviann Spare MD on 01/31/2010 at 00:00:00  Privacy Notice      Imported By: Scharlene Corn 02/02/2010 10:29:52    _____________________________________________________________________    External Attachment:    Please see [employer] EMR for this document.

## 2010-01-31 NOTE — Unmapped (Signed)
Signed by Viviann Spare MD on 01/31/2010 at 00:00:00  sgnt on file      Imported By: Scharlene Corn 02/02/2010 10:30:05    _____________________________________________________________________    External Attachment:    Please see [employer] EMR for this document.

## 2010-02-01 NOTE — Unmapped (Signed)
Signed by Elizabeth Palau on 02/01/2010 at 09:06:00              Viviann Spare, M.D.  [address]  [place], [place] [address]  Phone [phone]  Fax [phone]     February 01, 2010      KARNE OZGA  [address]    [place], [place] [address]                                                                                           RE:  TEST RESULTS   OSSO Sivertson--30-May-1950)         Dear Ms. Leger:      The following is an interpretation of your most recent tests.  Please take note of any instructions provided.   Electrolyte studies:  Normal, Blood sugar normal         Glucose test normal:  84   Kidney function studies:  Normal    Liver function studies:  Normal    Lipid panel:  Normal          Triglyceride: 88   Cholesterol: 190   LDL: 113   HDL: 59   Chol/HDL%:  3.2              Sincerely,         Viviann Spare, M.D.

## 2010-06-14 NOTE — Unmapped (Signed)
Signed by Viviann Spare MD on 06/14/2010 at 21:41:46      Reason for Visit   Chief Complaint: check area on right shoulder.  arm pain going down    History from: patient    Allergies  ! * LATEX TAPE    Medications   OTC Meds Reviewed  Medication list reviewed during this update. B COMPLEX   TABS (B COMPLEX VITAMINS) daily  VITAMIN D   TABS (CHOLECALCIFEROL TABS) 400 IU daily  CALCIUM   TABS (CALCIUM TABS) daily  ALLEGRA-D 24 HOUR 180-240 MG TB24 (FEXOFENADINE-PSEUDOEPHEDRINE) Take one tablet by mouth daily for allergies  VITAMIN E 400 IU CAPS (VITAMIN E) one by mouth daily  B-12  TABS (CYANOCOBALAMIN TABS) 1 tab daily  TAMOXIFEN CITRATE  TABS (TAMOXIFEN CITRATE TABS) 1 by mouth daily          Vital Signs:   Wt: 155 lbs.      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  She has a sharp, shooting pain for 3 months that radiates from her right deltoid, under her arm to her right wrist.  Certain positions worsen the symptoms, such when she stretches to reach something. There is no weakness or tingling. The pain is intermittent. She is concerned that the fatty tumor is contributing to the arm pain.    She has diarrhea with Tamoxifen. When she increased her fiber, her symptoms improved. She appetite and weight have improved.     History of Present Illness: Medical Student    HPI:  Cyst on shoulder for 1.5 years She has been seen here before for this.     The pain - began about three months ago. reaching awkward position laying on her arm. She does exercise zumba. Sharp shooting pain sudden and relaxing it goes away. Begins at the lateral right upp chest and radiates down her arm and stops at the rist. now  occasionally sharp pain that goes down the arm.     Diarrhea with tamoxifen. She believes it is lack of fiber. Last bought of diarrhea was two weeks ago taking immodium and increased her fiber and the diarrhea went away. She has lost control has not been able to hold it in.     PE:  Gen: NAD, AAO x4  HEENT: No lymphadenopathy, no thyromegally.  CV: RRR, normal S1 and S2  Resp:No increased work of Breathing, CTAB  Ext: No pain or tendernes on passive/avtive motion. 2+ upper extremity reflexes equal on both sides.   Cyst: Soft well circumscribed, nontender cyst on right shoulder.     A/P: 60 yo female with pain in her right shoulder.  1. Right shoulder pain/Arm pain - likely pinched nerve. Reassurance and observation.  2. Diarrhea - cont current fiber intake, and observe for changes.  3. Cyst - observe for size change. It is likely a benign fatty tumor.   PAST HISTORY  Past Medical History (reviewed - no changes required):  spinal stenosis, Primary Breast cancer, IBS, CTS-left hand  Surgical History (reviewed - no changes required):  BTL, L  breast lumpectomy 04/2008    Family History (reviewed - no changes required): DENIES FAMILY HX MM  Mother- deceased from colon cancer in 56  Father- deceased, HTN, prostate cancer  Brother- prostate cancer  Brother (2)- esophageal constriction  Sister- colon polyp  Sister (2)- TAH  Mother - deceased, DM, uterine cancer  Cousin - colon cancer  Social History (reviewed - no changes required): Preferred Language: English,   Marital Status: married,   Employment Status: employed full-time,   Associate Professor: Teacher, adult education.,   Occupation: Engineer, drilling  Exercise: walks daily,   Caffeine per Day: <1  Alcohol Use: none  Drug Use: none  Tobacco Usage:non-smoker    Review of Systems  General: Denies fevers, fatigue.   Musculoskeletal: Complains of muscle pains. Denies joint pain, stiffness.   Skin: Denies skin color change, pruritus.   Neurologic: Denies weakness, paresthesias.        Physical Examination:   BP: 140/  80    Physical Exam- Detail:   General Appearance: well-developed, well-nourished and in no acute distress.  Skin: No suspicious rashes or lesions.  Respiratory: Respiration un-labored.  Lung fields clear to auscultation.  No wheezing, rales, rhonchi or pleural rub.  Breast: Mildly tender at insertion of right deltod  Cardiac: S1 and S2 normal.  RRR without murmurs, rubs, gallops.  No JVD.  Vascular: No edema  Right Upper Extremity: Well circumscribed soft mass overlying anterolat shoulder.  No erythema, calor, tenderness, not mobile. FROM of shoulder and elbow.      Labs/Tests Reviewed  Hemoglobin: 13.3 (01/31/2010 10:38:00 AM)  Hematocrit: 40.0 (01/31/2010 10:38:00 AM)  MCV: 93.2 (01/31/2010 10:38:00 AM)  MCH: 30.9 (01/31/2010 10:38:00 AM)  WBC: 5.4 THOUSAND/UL (01/31/2010 10:38:00 AM)  Platelets: 271 THOUSAND/UL (01/31/2010 10:38:00 AM)  HgbA1C: 5.6 (02/24/2009 10:39:00 AM)  Serum Glucose: 84 (01/31/2010 10:38:00 AM)  Total Protein: 7.7 (01/31/2010 10:38:00 AM)  Albumin: 4.6 (01/31/2010 10:38:00 AM)  AST: 19 (01/31/2010 10:38:00 AM)  ALT: 18 (01/31/2010 10:38:00 AM)  Alkaline Phosphatase: 94 (01/31/2010 10:38:00 AM)  Total Bilirubin: 0.4 (01/31/2010 10:38:00 AM)  Serum Cholesterol 190 (01/31/2010 10:38:00 AM)  LDL: 113 (01/31/2010 10:38:00 AM)  HDL: 59 (01/31/2010 10:38:00 AM)  Triglyceride: 88 (01/31/2010 10:38:00 AM)  Sodium: 141 (01/31/2010 10:38:00 AM)  Potassium: 4.8 (01/31/2010 10:38:00 AM)  Chloride: 107 (01/31/2010 10:38:00 AM)  BUN: 16 (01/31/2010 10:38:00 AM)  Creatinine: 0.90 (01/31/2010 10:38:00 AM)  Serum Calcium: 9.7 (01/31/2010 10:38:00 AM)  Pap smear: Normal (09/08/2009 10:05:02 AM)       New Problems:  BREAST CANCER (ICD-174.9)  New Medications:  TAMOXIFEN CITRATE  TABS (TAMOXIFEN CITRATE TABS) 1 by mouth daily  DIFLUCAN 150 MG TABS (FLUCONAZOLE) one by mouth once        Preventive Maintenance       Coordinating Care Providers   PCP Name: [facility]  OB/GYN: Dr. Charlett Blake  Oncologist: Dr. Magdalene River  Radiation Oncologist: Dr. Marciano Sequin  Other: Dr. Kalman Drape, Breast surgeon            Prescriptions:  DIFLUCAN 150 MG TABS (FLUCONAZOLE) one by mouth once  #1 x 2   Entered and Authorized by: Viviann Spare MD   Signed by: Viviann Spare MD on 06/14/2010   Method used: Print then Give to Patient   RxID: [id number]      Assessment and Plan   1. 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 Deltoid strain-recommend ROM exercises  3. Recurrent candida vaginitis-RF on Diflucan at pt's request.  Follow-up if no improvement or worsening of symptoms.     Problems New Problems:  Dx of BREAST CANCER (ICD-174.9)  Onset: 06/14/2010    Medications   New Prescriptions/Refills:  DIFLUCAN 150 MG TABS (FLUCONAZOLE) one by mouth once  #1 x 2, 06/14/2010, Viviann Spare MD    Today's Orders   [phone] - Ofc Vst, Est Level III [email or web address]                ]

## 2010-08-08 NOTE — Unmapped (Addendum)
Signed by Viviann Spare MD on 08/12/2010 at 09:52:48  Hematology/Oncology      Imported By: Scharlene Corn 08/12/2010 09:18:33    _____________________________________________________________________    External Attachment:    Please see [employer] EMR for this centepic_document_incr.

## 2010-10-05 LAB — TSH: TSH: 2.81 microintl units/mL (ref 0.40–4.50)

## 2010-10-05 LAB — THYROID PANEL
Free Thyroxine Index: 2 (ref 1.4–3.8)
T3 Uptake Ratio: 29 % (ref 22–35)
T4, Total: 6.9 ug/dL (ref 4.5–12.0)

## 2010-10-05 LAB — CBC AND DIFFERENTIAL
Basophils Absolute: 20 10*3/uL (ref 0–200)
Basophils Relative: 0.4 %
Eosinophils Absolute: 59 10*3/uL (ref 15–500)
Eosinophils Relative: 1.2 %
Hematocrit: 36.1 % (ref 35.0–45.0)
Hemoglobin: 12 g/dL (ref 11.7–15.5)
Lymphocytes Absolute: 1740 10*3/uL (ref 850–3900)
Lymphocytes Relative: 35.5 %
MCH: 30.8 pg (ref 27.0–33.0)
MCHC: 33.2 g/dL (ref 32.0–36.0)
MCV: 92.8 fL (ref 80.0–100.0)
Monocytes Absolute: 372 10*3/uL (ref 200–950)
Monocytes Relative: 7.6 %
Neutrophils Absolute: 2710 10*3/uL (ref 1500–7800)
Neutrophils Relative: 55.3 %
Platelets: 194 10*3/uL (ref 140–400)
RBC: 3.89 10*6/uL (ref 3.80–5.10)
RDW: 14.1 % (ref 11.0–15.0)
WBC: 4.9 10*3/uL (ref 3.8–10.8)

## 2010-10-05 LAB — HEPATIC FUNCTION PANEL
A/G Ratio: 1.4 (ref 1.0–2.1)
ALT: 16 units/L (ref 6–40)
AST: 26 units/L (ref 10–35)
Albumin: 4.2 g/dL (ref 3.6–5.1)
Alkaline Phosphatase: 75 units/L (ref 33–130)
Bilirubin, Direct: 0 mg/dL (ref <=0.2)
Bilirubin, Indirect: 0.2 mg/dL (ref 0.2–1.2)
Globulin, Total: 2.9 g/dL (ref 2.2–3.9)
Total Bilirubin: 0.2 mg/dL (ref 0.2–1.2)
Total Protein: 7.1 g/dL (ref 6.2–8.3)

## 2010-10-05 LAB — LIPASE: Lipase: 42 IU/L (ref 7–60)

## 2010-10-05 LAB — RENAL FUNCTION PANEL W/EGFR
Albumin: 4.2 g/dL (ref 3.6–5.1)
BUN: 16 mg/dL (ref 7–25)
CO2: 25 mmol/L (ref 21–33)
Calcium: 9.4 mg/dL (ref 8.6–10.4)
Chloride: 103 mmol/L (ref 98–110)
Creatinine: 0.96 mg/dL (ref 0.50–1.05)
GFR MDRD Af Amer: 75 mL/min (ref >=60)
GFR MDRD Non Af Amer: 65 mL/min (ref >=60)
Glucose: 83 mg/dL (ref 65–99)
Phosphorus: 4.3 mg/dL (ref 2.5–4.5)
Potassium: 4.4 mmol/L (ref 3.5–5.3)
Sodium: 136 mmol/L (ref 135–146)

## 2010-10-05 LAB — IGA: IgA: 133 mg/dL (ref 81–463)

## 2010-10-05 LAB — SED RATE: Sed Rate: 4 mm/hr (ref <=30)

## 2010-10-05 NOTE — Unmapped (Signed)
Signed by Stefan Church MD on 10/06/2010 at 14:34:18  Patient: Amy Mcmillan  Note: All result statuses are Final unless otherwise noted.    Tests: (1) THYROID PANEL WITH TSH (QDL-7444)    T3 UPTAKE                 29 %                        22-35   T4 (THYROXINE), TOTAL                              6.9 mcg/dL                  4.5-12.0    FREE T4 INDEX (T7)        2.0                         1.4-3.8    Note: An exclamation mark (!) indicates a result that was not dispersed into   the flowsheet.  Document Creation Date: 10/06/2010 1:01 AM  _______________________________________________________________________    (1) Order result status: Final  Collection or observation date-time: 10/05/2010 15:48  Requested date-time:   Receipt date-time: 10/05/2010 15:50  Reported date-time: 10/06/2010 00:00  Referring Physician:    Ordering Physician: Jim Like [facility])  Specimen Source: S  Source: Arline Asp Order Number: [id number]  Lab site: [facility] [place]      [address]      [place]  [place]  [address]

## 2010-10-05 NOTE — Unmapped (Signed)
Signed by Stefan Church MD on 10/14/2010 at 12:14:25  Patient: Laqueta Jean  Note: All result statuses are Final unless otherwise noted.    Tests: (1) CLOSTRIDIUM DIFFICILE TOXIN A AND B, EIA ([id number])  ! CLOSTRIDIUM DIFFICILE TOXIN A AND B, EIA                              .              CLOSTRIDIUM DIFFICILE TOXIN A AND B, EIA                 MICRO NUMBER:      [id number]        TEST STATUS:       FINAL        SPECIMEN SOURCE:   STOOL        SPECIMEN QUALITY:  ADEQUATE        RESULT:            Not Detected    Note: An exclamation mark (!) indicates a result that was not dispersed into   the flowsheet.  Document Creation Date: 10/13/2010 2:49 PM  _______________________________________________________________________    (1) Order result status: Final  Collection or observation date-time: 10/05/2010  Requested date-time:   Receipt date-time: 10/07/2010 01:02  Reported date-time: 10/13/2010 14:00  Referring Physician:    Ordering Physician: Jim Like [facility])  Specimen Source: T  Source: Arline Asp Order Number: [id number]  Lab site: [facility] [place]      [address]      [place]  [place]  [address]

## 2010-10-05 NOTE — Unmapped (Signed)
Signed by Temple Pacini on 10/13/2010 at 10:42:59                 [facility]         Digestive Diseases         [address]         [place], [place] [address]         p [phone] f [phone]         [email or web address]      October 05, 2010      RE:  Amy Mcmillan  DOB: 1951/01/17        Dear Docia Chuck, MD,    It was a pleasure seeing your patient, Amy Mcmillan at your request for Consultation.  Please find the evaluation of this visit below;    ASSESSMENT AND PLAN  Suspect the incontinence secondary to pelvic prolapse and weakened musculature. OK function when stools solid - but incontinence when diarrhea. Will request colon records. ?microscopic colitis - other. See stools and labs for thyroid, pathogens, celiac, etc. To begin dicyclomine and sphincter strengthening exercises. Discussion. All questions addressed.      I would like to thank you for allowing me to see this patient and to share in their care. If I can be of any further assistance, please feel free to contact me.       Sincerely,      Stefan Church, M.D., F.A.C.G.  Medical Director, [facility]

## 2010-10-05 NOTE — Unmapped (Signed)
Signed by Stefan Church MD on 10/06/2010 at 14:34:48  Patient: Amy Mcmillan  Note: All result statuses are Final unless otherwise noted.    Tests: (1) LIPASE (QDL-606)    LIPASE                    42 U/L                      7-60    Note: An exclamation mark (!) indicates a result that was not dispersed into   the flowsheet.  Document Creation Date: 10/06/2010 1:01 AM  _______________________________________________________________________    (1) Order result status: Final  Collection or observation date-time: 10/05/2010 15:48  Requested date-time:   Receipt date-time: 10/05/2010 15:50  Reported date-time: 10/06/2010 00:00  Referring Physician:    Ordering Physician: Jim Like [facility])  Specimen Source: S  Source: Arline Asp Order Number: [id number]  Lab site: [facility] [place]      [address]        [place]  [address]      -----------------    The following lab values were dispersed to the flowsheet  with no units conversion:      LIPASE, 42 U/L, (F)  expected units: intl units/L

## 2010-10-05 NOTE — Unmapped (Signed)
Signed by Stefan Church MD on 10/14/2010 at 12:14:25  Patient: Amy Mcmillan  Note: All result statuses are Final unless otherwise noted.    Tests: (1) OVA AND PARASITES, STOOL CONC AND PERM SMEAR (QDL-681)  ! OVA AND PARASITES, STOOL CONC AND PERM SMEAR                              .              OVA AND PARASITES, STOOL CONC AND PERM SMEAR                 MICRO NUMBER:      [id number]        TEST STATUS:       FINAL        SPECIMEN SOURCE:   STOOL        SPECIMEN QUALITY:  ADEQUATE        CONCENTRATION 1:   No ova or parasites seen        TRICHROME 1:       No ova or parasites seen    Note: An exclamation mark (!) indicates a result that was not dispersed into   the flowsheet.  Document Creation Date: 10/13/2010 2:49 PM  _______________________________________________________________________    (1) Order result status: Final  Collection or observation date-time: 10/05/2010  Requested date-time:   Receipt date-time: 10/07/2010 01:02  Reported date-time: 10/13/2010 14:00  Referring Physician:    Ordering Physician: Jim Like [facility])  Specimen Source: T  Source: Arline Asp Order Number: [id number]  Lab site: [facility] [place]      [address]      [place]  [place]  [address]

## 2010-10-05 NOTE — Unmapped (Signed)
Signed by Stefan Church MD on 10/14/2010 at 12:14:25  Patient: Amy Mcmillan  Note: All result statuses are Final unless otherwise noted.    Tests: (1) GIARDIA AG, EIA, STOOL (HYQ-6578)  ! GIARDIA AG, EIA, STOOL                              .              GIARDIA AG, EIA, STOOL                 MICRO NUMBER:      [id number]        TEST STATUS:       FINAL        SPECIMEN SOURCE:   STOOL        SPECIMEN QUALITY:  ADEQUATE        RESULT 1:          Not Detected    Note: An exclamation mark (!) indicates a result that was not dispersed into   the flowsheet.  Document Creation Date: 10/13/2010 2:49 PM  _______________________________________________________________________    (1) Order result status: Final  Collection or observation date-time: 10/05/2010  Requested date-time:   Receipt date-time: 10/07/2010 01:02  Reported date-time: 10/13/2010 14:00  Referring Physician:    Ordering Physician: Jim Like [place])  Specimen Source: *  Source: Arline Asp Order Number: [id number]  Lab site: [facility] [place]      [address]      [place]  [place]  [address]

## 2010-10-05 NOTE — Unmapped (Addendum)
Signed by Stefan Church MD on 10/05/2010 at 15:44:15    [facility]  Division of Digestive Diseases        Consultation Requested By: Docia Chuck, MD  History from: patient  Reason for visit: Consultation  Chief Complaint: diarrhea    HISTORY OF PRESENT ILLNESS  60 retiree. Problem of chronic diarrhea. States that when 5-6 mo after began Arthmodex developed diarrhea. Loose stools coming on a few times daily. Also difficulty with control. Occasional accidents. No bleeding or pain. Some gas. Switched to Tamoxifen - no change. Perhaps less when skips a dose. Taking Imodium - helps. No nausea or vomiting. Apparently colonoscopy last year (Dr Kriste Basque) normal. ?biopsies (records requested. Mother had CRC advanced age. NO lactose intol. 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 Concern that it may have recurred. Sober habits. No regular NSAIDs.Uterine prolapse. Has to manually reduce very often. Some urinary urgency.    VITAL SIGNS    Height: 64 inches    Weight: 149 lbs/ 67.59 kg.    BMI (in-lb): 25.67    ALLERGIES    ! * LATEX TAPE    MEDICATIONS (on Intake):    B COMPLEX   TABS (B COMPLEX VITAMINS) daily  VITAMIN D   TABS (CHOLECALCIFEROL TABS) 400 IU daily  CALCIUM   TABS (CALCIUM TABS) daily  ALLEGRA-D 24 HOUR 180-240 MG TB24 (FEXOFENADINE-PSEUDOEPHEDRINE) Take one tablet by mouth daily for allergies  VITAMIN E 400 IU CAPS (VITAMIN E) one by mouth daily  B-12  TABS (CYANOCOBALAMIN TABS) 1 tab daily  TAMOXIFEN CITRATE  TABS (TAMOXIFEN CITRATE TABS) 1 by mouth daily  DIFLUCAN 150 MG TABS (FLUCONAZOLE) one by mouth once      Intake recorded by:  Jimmie Molly MA  October 05, 2010 2:48 PM                 Medications reviewed, updated and verified with patient or patient representative.    Screening for unhealthy alcohol use performed.  Women (Any Age) or Men (over Age 70): nondrinker    Depression Screening:     During the past month,   Have you often been bothered by feeling down,  depressed or hopeless? No  Have you often been bothered by little interest or pleasure in doing things? No    PAST HISTORY  Past Medical History (reviewed - no changes required):  spinal stenosis, Primary Breast cancer, IBS, CTS-left hand  Surgical History (reviewed - no changes required):  BTL, L breast lumpectomy 04/2008    Family History (reviewed - no changes required): DENIES FAMILY HX MM  Mother- deceased from colon cancer in 71  Father- deceased, HTN, prostate cancer  Brother- prostate cancer  Brother (2)- esophageal constriction  Sister- colon polyp  Sister (2)- TAH  Mother - deceased, DM, uterine cancer  Cousin - colon cancer  Social History (reviewed - no changes required): Preferred Language: English,   Marital Status: married,   Employment Status: employed full-time,   Associate Professor: Teacher, adult education.,   Occupation: Engineer, drilling  Exercise: walks daily,   Caffeine per Day: <1  Alcohol Use: none  Drug Use: none  Tobacco Usage:non-smoker      REVIEW OF SYSTEMS   See scanned Review of Systems sheet    PHYSICAL EXAMINATION   Constitutional: Black, female, alert, well developed, well nourished.  Skin: Normal on inspection, normal on palpation.  Head: Grossly symmetrical and asymptomatic, oropharynx and tongue normal.  Eyes: Normal inspection of eye and lids,  no scleral icterus.  Ears/Nose/Mouth/Throat: Hearing grossly intact, septum midline, nares patent, healthy dentition, no gingival inflammation lips, pharyngeal walls and tonsillar fossae without abnormalities.  Neck: Supple, no masses, no thyromegaly.  Breasts: Deferred.  Lymphatic: No evidence of lymphadenopathy in the neck, no evidence of lymphadenopathy in the supraclavicular area.  Cardiovascular: Auscultation: regular rate and rhythm, normal s1, normal s2, no rub, no murmurs, no gallops, carotid arteries: no bruits, peripheral circulation: no cyanosis, no clubbing, no lower extremity edema, no ulceration, pulses are equal and  symmetrical.  Respiratory: No respiratory distress, clear to auscultation and percussion.  Gastrointestional: Abdomen: soft, non-tender, non-distended, no masses,  bowel sounds present, no rebound tenderness, no guarding, no organomegaly, no evidence of ascites, no evidence of caput, no surgical scars, no hernias, anus/ perineum and rectum: no tags, no fissure, no fistula, no abscess, no evidence of prolapse, healthy peri-anal skin, rectal: normal inspection, digital exam with normal resting and squeeze, no luminal masses or tenderness.  Musculoskeletal: Grossly normal musculature, normal range of motion, nails: no thickening discoloration or pitting.  Neurological: No asterixis, normal gait, normal muscle strength, cn ii-xii grossly intact.  Psychiatric: Affect and mood are appropriate, alert and oriented to person-place-time.      LABS REVIEWED    Albumin: 4.6 (01/31/2010 10:38:00 AM)    Alk Phos: 94 (01/31/2010 10:38:00 AM)         AST: 19 (01/31/2010 10:38:00 AM)    ALT: 18 (01/31/2010 10:38:00 AM)    Alkaline Phosphatase: 94 (01/31/2010 10:38:00 AM)    Total Bilirubin: 0.4 (01/31/2010 10:38:00 AM)    Total Protein: 7.7 (01/31/2010 10:38:00 AM)    Absolute Neutrophils: 3591 CELLS/UL (01/31/2010 10:38:00 AM)         Hemoglobin: 13.3 (01/31/2010 10:38:00 AM)         Hematocrit: 40.0 (01/31/2010 10:38:00 AM)    MCV: 93.2 (01/31/2010 10:38:00 AM)    MCH: 30.9 (01/31/2010 10:38:00 AM)    WBC: 5.4 THOUSAND/UL (01/31/2010 10:38:00 AM)    Platelets: 271 THOUSAND/UL (01/31/2010 10:38:00 AM)    Calcium: 9.7 (01/31/2010 10:38:00 AM)         Serum Glucose: 84 (01/31/2010 10:38:00 AM)    Sodium: 141 (01/31/2010 10:38:00 AM)    Potassium: 4.8 (01/31/2010 10:38:00 AM)    Chloride: 107 (01/31/2010 10:38:00 AM)    BUN: 16 (01/31/2010 10:38:00 AM)    Creatinine: 0.90 (01/31/2010 10:38:00 AM)    Serum Calcium: 9.7 (01/31/2010 10:38:00 AM)    Pap smear: Normal (09/08/2009 10:05:02 AM)    Triglycerides: 88 (01/31/2010 10:38:00  AM)        ASSESSMENT and PLAN   Suspect the incontinence secondary to pelvic prolapse and weakened musculature. OK function when stools solid - but incontinence when diarrhea. Will request colon records. ?microscopic colitis - other. See stools and labs for thyroid, pathogens, celiac, etc. To begin dicyclomine and sphincter strengthening exercises. Discussion. All questions addressed.    Problems  DIARRHEA (ICD-787.91)    Today's Orders   CBC + plat + Diff  (0981) [*CPT-85025]  Renal Function Panel   (KIDNEY) (10314) [CPT-80069]  Hepatic Function    (LIVP) (10256)  [CPT-80076]  Sedimentation Rate   (ESR) (809) [CPT-85651]  IgA (539) [CPT-82784]  Thyroid Profile (T3,T4,TSH)  (7444) [CPT-80092]  Transglut  IGA Antibody, Tissue  (TRANSGLU)  (8821) [CPT-83516]  Fecal Leukocytes  (FLTC) (3930) [*CPT-89055]  C. Diff Toxin A&B  (19147) [*CPT-87324]  Stool, Ova & Parasites  (OP) (681) [CPT-87177]  Stool, Giardia Antigen   Katherina Right) (  9562) [CPT-87328]  Lipase  (LIP) (606) [CPT-83690]  H. Pylori antigen EIA Stool (13086) [CPT-87338]    Medications   DICYCLOMINE HCL 10 MG CAPS (DICYCLOMINE HCL) Take one twice daily  #60 x 3, 10/05/2010, Stefan Church MD    Risks & Benefits:     * Risks, benefits and treatment options discussed with patient.  Disposition:     Return to clinic for MD visit as scheduled                   CC:   Docia Chuck, MD              Prescriptions:  DICYCLOMINE HCL 10 MG CAPS (DICYCLOMINE HCL) Take one twice daily  #60 x 3   Entered and Authorized by: Stefan Church MD   Signed by: Stefan Church MD on 10/05/2010   Method used: Print then Give to Patient   RxID: [id number]      Process Orders  Check Orders Results:      [facility]: Order checked:       -- Transglut  IGA Antibody, Tissue  (TRANSGLU)  [phone]) --  [NO CODE FOUND]   Tests Sent for requisitioning (October 05, 2010 3:39 PM):      10/05/2010: [facility] -- CBC + plat + Diff  (6399) [*CPT-85025] (signed)      10/05/2010: [facility]  -- Renal Function Panel   (KIDNEY) (10314) [CPT-80069] (signed)      10/05/2010: [facility] -- Hepatic Function    (LIVP) (10256)  [CPT-80076] (signed)      10/05/2010: [facility] -- Sedimentation Rate   (ESR) (809) [CPT-85651] (signed)      10/05/2010: [facility] -- IgA (539) [CPT-82784] (signed)      10/05/2010: [facility] -- Thyroid Profile (T3,T4,TSH)  (7444) [CPT-80092] (signed)      10/05/2010: [facility] -- [place]ene Dales  IGA Antibody, Tissue  (TRANSGLU)  [phone]) [CPT-83516] (signed)      10/05/2010: [facility] -- Fecal Leukocytes  (FLTC) (3930) [*CPT-89055] (signed)      10/05/2010: [facility] -- C. Diff Toxin A&B  (53664) [*CPT-87324] (signed)      10/05/2010: [facility] -- Stool, Ova & Parasites  (OP) (681) [CPT-87177] (signed)      10/05/2010: [facility] -- Stool, Giardia Antigen   Katherina Right) [phone]) [CPT-87328] (signed)      10/05/2010: [facility] -- Lipase  (LIP) (606) [CPT-83690] (signed)      10/05/2010: [facility] -- H. Pylori antigen EIA Stool (74259) [CPT-87338] (signed)         Signed by Stefan Church MD on 10/06/2010 at 14:36:48    Patient's recent 'colonoscopy' procedure by community physician was a flex sig (to 70 cm). Biopsies normal.

## 2010-10-05 NOTE — Unmapped (Signed)
Signed by Stefan Church MD on 10/06/2010 at 14:34:48  Patient: Amy Mcmillan  Note: All result statuses are Final unless otherwise noted.    Tests: (1) SED RATE BY MODIFIED WESTERGREN (QDL-809)   SED RATE BY MODIFIED WESTERGREN                              4 mm/h                      < OR = 30    Note: An exclamation mark (!) indicates a result that was not dispersed into   the flowsheet.  Document Creation Date: 10/06/2010 3:06 AM  _______________________________________________________________________    (1) Order result status: Final  Collection or observation date-time: 10/05/2010 15:48  Requested date-time:   Receipt date-time: 10/05/2010 15:50  Reported date-time: 10/06/2010 02:00  Referring Physician:    Ordering Physician: Jim Like [facility])  Specimen Source: B  Source: Arline Asp Order Number: [id number]  Lab site: [facility] [place]      [address]      [place]  [place]  [address]

## 2010-10-05 NOTE — Unmapped (Signed)
Signed by Generic  UCP Provider on 10/05/2010 at 00:00:00  GI Review of Systems      Imported By: Coletta Memos 10/13/2010 21:55:42    _____________________________________________________________________    External Attachment:    Please see Shamyia Grandpre EMR for this centepic_document_incr.

## 2010-10-05 NOTE — Unmapped (Signed)
Signed by Stefan Church MD on 10/06/2010 at 14:36:48  Patient: Amy Mcmillan  Note: All result statuses are Final unless otherwise noted.    Tests: (1) HEPATIC FUNCTION PANEL (QDL-10256)    PROTEIN, TOTAL            7.1 g/dL                    1.6-1.0    ALBUMIN                   4.2 g/dL                    9.6-0.4    GLOBULIN (calc)           2.9 g/dL                    5.4-0.9   ALBUMIN/GLOBULIN RATIO (calc)                              1.4                         1.0-2.1    BILIRUBIN, TOTAL          0.2 mg/dL                   8.1-1.9    BILIRUBIN, DIRECT         0.0 mg/dL                   < OR = 0.2   BILIRUBIN, INDIRECT (calc)                              0.2 mg/dL                   1.4-7.8    ALKALINE PHOSPHATASE      75 U/L                      33-130    AST                       26 U/L                      10-35    ALT                       16 U/L                      6-40    Note: An exclamation mark (!) indicates a result that was not dispersed into   the flowsheet.  Document Creation Date: 10/06/2010 1:01 AM  _______________________________________________________________________    (1) Order result status: Final  Collection or observation date-time: 10/05/2010 15:48  Requested date-time:   Receipt date-time: 10/05/2010 15:50  Reported date-time: 10/06/2010 00:00  Referring Physician:    Ordering Physician: Jim Like [facility])  Specimen Source: S  Source: Arline Asp Order Number: [id number]  Lab site: [facility] [place]      [address]      [place]  [place]  [address]

## 2010-10-05 NOTE — Unmapped (Signed)
Signed by Stefan Church MD on 10/06/2010 at 14:34:18  Patient: Amy Mcmillan  Note: All result statuses are Final unless otherwise noted.    Tests: (1) TISSUE TRANSGLUTAMINASE AB, IGA (BJY-7829)  ! TISSUE TRANSGLUTAMINASE AB, IGA                              <3 U/mL      Value   Explanation of Test Results      <5      Negative      5-8     Equivocal      >8      Positive           Note: An exclamation mark (!) indicates a result that was not dispersed into   the flowsheet.  Document Creation Date: 10/06/2010 1:06 PM  _______________________________________________________________________    (1) Order result status: Final  Collection or observation date-time: 10/05/2010 15:48  Requested date-time:   Receipt date-time: 10/05/2010 15:50  Reported date-time: 10/06/2010 12:00  Referring Physician:    Ordering Physician: Jim Like [facility])  Specimen Source: S  Source: Arline Asp Order Number: [id number]  Lab site: [facility] [place]      [address]      [place]  [place]  [address]

## 2010-10-05 NOTE — Unmapped (Signed)
Signed by Stefan Church MD on 10/06/2010 at 14:34:48  Patient: Amy Mcmillan  Note: All result statuses are Final unless otherwise noted.    Tests: (1) CBC (INCLUDES DIFF/PLT) (QDL-6399)   WHITE BLOOD CELL COUNT                              4.9 Thousand/uL             3.8-10.8    RED BLOOD CELL COUNT      3.89 Million/uL             3.80-5.10    HEMOGLOBIN                12.0 g/dL                   87.5-64.3    HEMATOCRIT                36.1 %                      35.0-45.0    MCV                       92.8 fL                     80.0-100.0    MCH                       30.8 pg                     27.0-33.0    MCHC                      33.2 g/dL                   32.9-51.8    RDW                       14.1 %                      11.0-15.0    PLATELET COUNT            194 Thousand/uL             140-400    ABSOLUTE NEUTROPHILS      2710 cells/uL               1500-7800    ABSOLUTE LYMPHOCYTES      1740 cells/uL               [phone]    ABSOLUTE MONOCYTES        372 cells/uL                200-950    ABSOLUTE EOSINOPHILS      59 cells/uL                 15-500    ABSOLUTE BASOPHILS        20 cells/uL                 0-200    NEUTROPHILS               55.3 %    LYMPHOCYTES  35.5 %    MONOCYTES                 7.6 %    EOSINOPHILS               1.2 %    BASOPHILS                 0.4 %    Note: An exclamation mark (!) indicates a result that was not dispersed into   the flowsheet.  Document Creation Date: 10/05/2010 11:29 PM  _______________________________________________________________________    (1) Order result status: Final  Collection or observation date-time: 10/05/2010 15:48  Requested date-time:   Receipt date-time: 10/05/2010 15:50  Reported date-time: 10/05/2010 23:00  Referring Physician:    Ordering Physician: Jim Like [facility])  Specimen Source: B  Source: Arline Asp Order Number: [id number]  Lab site: [facility] [place]      [address]      [place]   [facility]  [address]      -----------------    The following lab values were dispersed to the flowsheet  with no units conversion:      WHITE BLOOD CELL COUNT, 4.9 THOUSAND/UL, (F)  expected units: 10*3/mm3    RED BLOOD CELL COUNT, 3.89 MILLION/UL, (F)  expected units: 10*6/mm3    PLATELET COUNT, 194 THOUSAND/UL, (F)  expected units: 10*3/mm3    ABSOLUTE NEUTROPHILS, 2710 CELLS/UL, (F)  expected units: K/uL    ABSOLUTE LYMPHOCYTES, 1740 CELLS/UL, (F)  expected units: 10*3/mm3    ABSOLUTE MONOCYTES, 372 CELLS/UL, (F)  expected units: 10*3/microliter    ABSOLUTE EOSINOPHILS, 59 CELLS/UL, (F)  expected units: 10*3/mm3    ABSOLUTE BASOPHILS, 20 CELLS/UL, (F)  expected units: K/uL

## 2010-10-05 NOTE — Unmapped (Signed)
Signed by Stefan Church MD on 10/06/2010 at 14:34:48  Patient: Amy Mcmillan  Note: All result statuses are Final unless otherwise noted.    Tests: (1) THYROID PANEL WITH TSH (QDL-7444)    TSH                       2.81 mIU/L                  0.40-4.50    Note: An exclamation mark (!) indicates a result that was not dispersed into   the flowsheet.  Document Creation Date: 10/06/2010 1:46 AM  _______________________________________________________________________    (1) Order result status: Final  Collection or observation date-time: 10/05/2010 15:48  Requested date-time:   Receipt date-time: 10/05/2010 15:50  Reported date-time: 10/06/2010 01:00  Referring Physician:    Ordering Physician: Jim Like [facility])  Specimen Source: S  Source: Arline Asp Order Number: [id number]  Lab site: [facility] [place]      [address]      [place]  [place]  [address]

## 2010-10-05 NOTE — Unmapped (Signed)
Signed by Stefan Church MD on 10/06/2010 at 14:34:18  Patient: Amy Mcmillan  Note: All result statuses are Final unless otherwise noted.    Tests: (1) IMMUNOGLOBULIN A (QDL-539)    IMMUNOGLOBULIN A          133 mg/dL                   16-109    Note: An exclamation mark (!) indicates a result that was not dispersed into   the flowsheet.  Document Creation Date: 10/06/2010 9:33 AM  _______________________________________________________________________    (1) Order result status: Final  Collection or observation date-time: 10/05/2010 15:48  Requested date-time:   Receipt date-time: 10/05/2010 15:50  Reported date-time: 10/06/2010 09:00  Referring Physician:    Ordering Physician: Jim Like [facility])  Specimen Source: S  Source: Arline Asp Order Number: [id number]  Lab site: [facility] [place]      [address]      [place]  [place]  [address]

## 2010-10-05 NOTE — Unmapped (Signed)
Signed by Stefan Church MD on 10/06/2010 at 14:34:48  Patient: Amy Mcmillan  Note: All result statuses are Final unless otherwise noted.    Tests: (1) RENAL FUNCTION PANEL (QDL-10314)    GLUCOSE                   83 mg/dL                    47-82                        Fasting reference interval           UREA NITROGEN (BUN)       16 mg/dL                    9-56    CREATININE                0.96 mg/dL                  0.50-1.05      For patients >40 years of age, the reference limit      for Creatinine is approximately 13% higher for people      identified as African-American.          eGFR NON-AFR. AMERICAN                              65 mL/min/1.18m2            > OR = 60   eGFR AFRICAN AMERICAN                              75 mL/min/1.63m2            > OR = 60   BUN/CREATININE RATIO (calc)                              NOT APPLICABLE              6-22    SODIUM                    136 mmol/L                  135-146    POTASSIUM                 4.4 mmol/L                  3.5-5.3    CHLORIDE                  103 mmol/L                  98-110    CARBON DIOXIDE            25 mmol/L                   21-33    CALCIUM                   9.4 mg/dL                   2.1-30.8   PHOSPHATE (AS PHOSPHORUS)  4.3 mg/dL                   1.6-1.0    ALBUMIN                   4.2 g/dL                    9.6-0.4    Note: An exclamation mark (!) indicates a result that was not dispersed into   the flowsheet.  Document Creation Date: 10/06/2010 1:01 AM  _______________________________________________________________________    (1) Order result status: Final  Collection or observation date-time: 10/05/2010 15:48  Requested date-time:   Receipt date-time: 10/05/2010 15:50  Reported date-time: 10/06/2010 00:00  Referring Physician:    Ordering Physician: Jim Like Randal[facility] Rhea)  Specimen Source: S  Source: Arline Asp Order Number: [id number]  Lab site: [facility] [place]   [address]      [place]  [place]  [address]      -----------------    The following non-numeric lab results were dispersed to  the flowsheet even though numeric results were expected:      BUN/CREATININE RATIO (calc), NOT APPLICABLE

## 2010-10-05 NOTE — Unmapped (Signed)
Signed by Stefan Church MD on 10/14/2010 at 12:14:25  Patient: Amy Mcmillan  Note: All result statuses are Final unless otherwise noted.    Tests: (1) FECAL LEUKOCYTE STAIN (QDL-3930)  ! FECAL LEUKOCYTE STAIN                              .              FECAL LEUKOCYTE STAIN                 MICRO NUMBER:      [id number]        TEST STATUS:       FINAL        SPECIMEN SOURCE:   STOOL        SPECIMEN QUALITY:  ADEQUATE        RESULT:            No leukocytes seen.    Note: An exclamation mark (!) indicates a result that was not dispersed into   the flowsheet.  Document Creation Date: 10/13/2010 2:49 PM  _______________________________________________________________________    (1) Order result status: Final  Collection or observation date-time: 10/05/2010  Requested date-time:   Receipt date-time: 10/07/2010 01:02  Reported date-time: 10/13/2010 14:00  Referring Physician:    Ordering Physician: Jim Like [facility])  Specimen Source: T  Source: Arline Asp Order Number: [id number]  Lab site: [facility] [place]      [address]      [place]  [place]  [address]

## 2011-08-21 ENCOUNTER — Ambulatory Visit: Admit: 2011-08-21 | Payer: PRIVATE HEALTH INSURANCE

## 2011-08-21 DIAGNOSIS — Z0000 Encounter for general adult medical examination without abnormal findings: Secondary | ICD-10-CM

## 2011-08-21 LAB — COMPREHENSIVE METABOLIC PANEL
A/G Ratio: 1.4 (calc) (ref 1.0–2.5)
ALT: 23 U/L (ref 6–40)
AST: 25 U/L (ref 10–35)
Albumin: 4.5 g/dL (ref 3.6–5.1)
Alkaline Phosphatase: 87 U/L (ref 33–130)
BUN: 13 mg/dL (ref 7–25)
CO2: 18 mmol/L (ref 19–30)
Calcium: 9.4 mg/dL (ref 8.6–10.4)
Chloride: 106 mmol/L (ref 98–110)
Creatinine: 0.88 mg/dL (ref 0.50–1.05)
GFR MDRD Af Amer: 83 mL/min/{1.73_m2} (ref >=60)
Globulin, Total: 3.2 g/dL (calc) (ref 1.9–3.7)
Glucose: 97 mg/dL (ref 65–99)
Potassium: 4.3 mmol/L (ref 3.5–5.3)
Sodium: 135 mmol/L (ref 135–146)
Total Bilirubin: 0.3 mg/dL (ref 0.2–1.2)
Total Protein: 7.7 g/dL (ref 6.1–8.1)
eGFR Non-Afr. American: 72 mL/min/{1.73_m2} (ref >=60)

## 2011-08-21 LAB — VITAMIN D 25 HYDROXY
25-Hydroxy, Vitamin D-2: <4 ng/mL
25-Hydroxy, Vitamin D-3: 11 ng/mL
Vit D, 25-Hydroxy: 11 ng/mL — ABNORMAL LOW (ref 30–100)

## 2011-08-21 LAB — LIPID PANEL
Chol/HDL Ratio: 2.9 (calc) (ref <=5.0)
Cholesterol, Total: 192 mg/dL (ref 125–200)
HDL: 66 mg/dL (ref >=46)
LDL Calculated: 101 mg/dL (calc) (ref <130)
Non HDL Chol. (LDL+VLDL): 126 mg/dL (calc)
Triglycerides: 123 mg/dL (ref <150)

## 2011-08-21 LAB — VITAMIN B12: Vitamin B-12: >2000 pg/mL — ABNORMAL HIGH (ref 200–1100)

## 2011-08-21 MED ORDER — fluconazole (DIFLUCAN) 150 MG tablet
150 | ORAL_TABLET | Freq: Once | ORAL | Status: AC
Start: 2011-08-21 — End: 2011-08-21

## 2011-08-21 NOTE — Unmapped (Signed)
Subjective  HPI:   Patient ID: Amy Mcmillan is a 61 y.o. female.    Chief Complaint:  HPI     Concerns: Here for PE  She takes Tamoxifen for breast cancer and has associated diarrhea.  She takes probiotics which helps.    Menstrual History  No LMP recorded. Sees Dr. Oswald Hillock for gynecology  Menstrual/Menopausal problems: has a cystocoele  Uterus/Ovaries present: no  Abnormal bleeding: no  She requests a script for Diflucan to have on hand if she develops a yeast infection.    Breast History  Self breast exam: yes  Last mammogram: October, sees oncologist every 6 months  History of breast cancer: yes 2009    Lifestyle  Diet: eats a good diet.  Calcium/Vitamin D: doesn't take supplements,  1 servings of soy milk  Exercise: does a lot of gardening, goes to the gym as well  Sleep: good  Safety: 100% seatbelts, minimal cell phone use in the car, no domestic violence   Emotions: sadness, feels drained due to the diarrhea.   Past Medical History   Diagnosis Date   ??? Spinal stenosis    ??? Breast cancer 03/2008     s/p XRT/Chemo   ??? IBS (irritable bowel syndrome)    ??? CTS (carpal tunnel syndrome)      LEFT HAND     Past Surgical History   Procedure Date   ??? Btl    ??? Breast lumpectomy 04/2008   ??? Bunionectomy bilateral      Family History   Problem Relation Age of Onset   ??? Colon polyps Sister    ??? Prostate cancer Brother    ??? Melanoma Neg Hx    ??? Uterine cancer Mother    ??? Colon cancer Cousin       Prior to Admission medications    Medication Sig Start Date End Date Taking? Authorizing Provider   CALCIUM ORAL Take by mouth daily.     Yes Historical Provider, MD   fexofenadine-pseudoephedrine (ALLEGRA-D 24) 180-240 mg per 24 hr tablet Take 1 tablet by mouth daily. FOR ALLERGIES  08/01/07  Yes Historical Provider, MD   TAMOXIFEN CITRATE (TAMOXIFEN ORAL) Take 1 tablet by mouth daily.     Yes Historical Provider, MD   vit b complex w-b 12 tablet Take 1 tablet by mouth daily.     Yes Historical Provider, MD     Allergies      Allergen Reactions   ??? Latex, Natural Rubber      LATEX TAPE   ??? Other Itching and Swelling     Social history- reports that she has never smoked. She has never used smokeless tobacco. She reports that she does not drink alcohol or use illicit drugs.   Immunization History   Administered Date(s) Administered   ??? Meningococcal Polysaccharide MPSV4 02/08/1994   ??? Td 02/22/1994, 08/18/2003       ROS:   Review of Systems   Constitutional: Negative for fever, chills, activity change and fatigue.   HENT: Negative for hearing loss and facial swelling.    Eyes: Negative for visual disturbance.   Respiratory: Negative for cough, chest tightness and shortness of breath.    Cardiovascular: Negative for chest pain, palpitations and leg swelling.   Gastrointestinal: Negative for nausea, abdominal pain, diarrhea, constipation and blood in stool.   Genitourinary: Negative for vaginal bleeding, vaginal discharge and pelvic pain.   Musculoskeletal: Negative for arthralgias.   Skin: Negative for color change and rash.  Neurological: Negative for weakness.   Psychiatric/Behavioral: Negative for behavioral problems.          Objective:   Physical Exam   Constitutional: She appears well-developed and well-nourished.   HENT:   Head: Normocephalic and atraumatic.   Right Ear: Hearing, tympanic membrane, external ear and ear canal normal.   Left Ear: Hearing, tympanic membrane, external ear and ear canal normal.   Nose: Nose normal.   Mouth/Throat: Oropharynx is clear and moist. No oropharyngeal exudate.   Eyes: Conjunctivae and EOM are normal. Pupils are equal, round, and reactive to light. Right eye exhibits no discharge. Left eye exhibits no discharge.   Neck: Trachea normal and normal range of motion. Neck supple. No JVD present. No mass and no thyromegaly present.   Cardiovascular: Normal rate, regular rhythm, normal heart sounds and normal pulses.    No murmur heard.  Pulmonary/Chest: Effort normal and breath sounds normal. No  respiratory distress. She has no wheezes. She has no rales. She exhibits no tenderness.   Abdominal: Soft. Normal appearance and bowel sounds are normal. There is no hepatosplenomegaly. There is no tenderness. No hernia.   Musculoskeletal: Normal range of motion.   Lymphadenopathy:     She has no cervical adenopathy.   Neurological: She is alert. She has normal strength. No cranial nerve deficit or sensory deficit.   Skin: Skin is warm and dry. No rash noted.   Psychiatric: She has a normal mood and affect. Her speech is normal and behavior is normal. Judgment and thought content normal. Cognition and memory are normal.         Filed Vitals:    08/21/11 0841   BP: 126/84   Pulse: 84   Temp: 97.8 ??F (36.6 ??C)   TempSrc: Oral   Height: 5' 5.5 (1.664 m)   Weight: 141 lb (63.957 kg)     Body mass index is 23.11 kg/(m^2).  Body surface area is 1.72 meters squared.    Assessment/Plan:     Yanin was seen today for annual exam.    Diagnoses and associated orders for this visit:    Well adult exam  - Routine Health Maintenance-Discussed eating 5 fruits and vegetables a day, 30 minutes of daily exercise, regular sleep and safety concerns.   - Comprehensive metabolic panel  - Lipid Profile  - Vitamin B12    Vaginitis  - fluconazole (DIFLUCAN) 150 MG tablet; Take 1 tablet (150 mg total) by mouth once. May repeat in 72 hours if symptoms persist.  F/u prn

## 2012-06-03 ENCOUNTER — Ambulatory Visit: Admit: 2012-06-03 | Payer: PRIVATE HEALTH INSURANCE

## 2012-06-03 ENCOUNTER — Inpatient Hospital Stay: Admit: 2012-06-03 | Payer: PRIVATE HEALTH INSURANCE

## 2012-06-03 DIAGNOSIS — K529 Noninfective gastroenteritis and colitis, unspecified: Secondary | ICD-10-CM

## 2012-06-03 LAB — DIFFERENTIAL
Basophils Absolute: 24 /uL (ref 0–200)
Basophils Relative: 0.4 % (ref 0.0–1.0)
Eosinophils Absolute: 66 /uL (ref 15–500)
Eosinophils Relative: 1.1 % (ref 0.0–8.0)
Lymphocytes Absolute: 1554 /uL (ref 850–3900)
Lymphocytes Relative: 25.9 % (ref 15.0–45.0)
Monocytes Absolute: 450 /uL (ref 200–950)
Monocytes Relative: 7.5 % (ref 0.0–12.0)
Neutrophils Absolute: 3906 /uL (ref 1500–7800)
Neutrophils Relative: 65.1 % (ref 40.0–80.0)

## 2012-06-03 LAB — COMPREHENSIVE METABOLIC PANEL, SERUM
ALT: 27 U/L (ref 13–69)
AST (SGOT): 29 U/L (ref 11–53)
Albumin: 3.8 g/dL (ref 3.6–5.1)
Alkaline Phosphatase: 85 U/L (ref 33–130)
Anion Gap: 6 mmol/L (ref 3–16)
BUN: 11 mg/dL (ref 7–25)
CO2: 29 mmol/L (ref 21–33)
Calcium: 9.1 mg/dL (ref 8.6–10.2)
Chloride: 107 mmol/L (ref 98–110)
Creatinine: 0.81 mg/dL (ref 0.50–1.20)
GFR MDRD Af Amer: 87 See note.
GFR MDRD Non Af Amer: 72 See note.
Glucose: 78 mg/dL (ref 65–99)
Osmolality, Calculated: 292 mosm/kg (ref 278–305)
Potassium: 4.8 mmol/L (ref 3.5–5.3)
Sodium: 142 mmol/L (ref 135–146)
Total Bilirubin: 0.4 mg/dL (ref 0.2–1.2)
Total Protein: 6.8 g/dL (ref 6.2–8.3)

## 2012-06-03 LAB — CBC
Hematocrit: 37.7 % (ref 35.0–45.0)
Hemoglobin: 12.4 g/dL (ref 11.7–15.5)
MCH: 30.2 pg (ref 27.0–33.0)
MCHC: 33 g/dL (ref 32.0–36.0)
MCV: 91.4 fL (ref 80.0–100.0)
MPV: 9.1 fL (ref 7.5–11.5)
Platelets: 295 10E3/uL (ref 140–400)
RBC: 4.13 10E6/uL (ref 3.80–5.10)
RDW: 13.5 % (ref 11.0–15.0)
WBC: 6 10E3/uL (ref 3.8–10.8)

## 2012-06-03 MED ORDER — fexofenadine-pseudoephedrine (ALLEGRA-D 24) 180-240 mg per 24 hr tablet
180-240 | ORAL_TABLET | Freq: Every day | ORAL | 0.00 refills | 30.00000 days | Status: AC
Start: 2012-06-03 — End: 2012-08-02

## 2012-06-03 NOTE — Unmapped (Signed)
Subjective  HPI:   Patient ID: Amy Mcmillan is a 62 y.o. female.    Chief Complaint:  HPI     She has chronic diarrhea that started in January 2011 after starting Tamoxifen for breast cancer.  She has watery, loose stools 3-4 after every meal.  She takes Imodium 4 tablets every 18 hours. She won't have a BM, then she will have a BM, which will happen quickley.  She often doesn't make it to the bathroom.  She denies any blood in her stool. She has now been off of Tamoxifen for 1 month and her symptoms are the same.  She does take a probiotic daily.  She has seen 2 gastroenterologists, her last colonoscopy was 2010.  She is reluctant to go back to her previous GI and wants to see another.    Past medical history, medications, allergies and problem visit reviewed prior to visit.  Past Medical History   Diagnosis Date   ??? Spinal stenosis    ??? Breast cancer 03/2008     s/p XRT/Chemo   ??? IBS (irritable bowel syndrome)    ??? CTS (carpal tunnel syndrome)      LEFT HAND     Past Surgical History   Procedure Laterality Date   ??? Btl     ??? Breast lumpectomy  04/2008   ??? Bunionectomy bilateral       Family History   Problem Relation Age of Onset   ??? Colon polyps Sister    ??? Prostate cancer Brother    ??? Melanoma Neg Hx    ??? Uterine cancer Mother    ??? Colon cancer Cousin       Prior to Admission medications    Medication Sig Start Date End Date Taking? Authorizing Provider   CALCIUM ORAL Take by mouth daily.     Yes Historical Provider, MD   fexofenadine-pseudoephedrine (ALLEGRA-D 24) 180-240 mg per 24 hr tablet Take 1 tablet by mouth daily. FOR ALLERGIES 06/03/12  Yes Stephaie Dardis J. Wilson Singer, MD   LACTOBACILLUS ACIDOPHILUS (PROBIOTIC ORAL) Take by mouth daily.   Yes Historical Provider, MD   LOPERAMIDE HCL (IMODIUM A-D ORAL) Take by mouth daily.   Yes Historical Provider, MD   SIMETHICONE (GAS-X ORAL) Take by mouth as needed.   Yes Historical Provider, MD   vit b complex w-b 12 tablet Take 1 tablet by mouth daily.     Yes Historical  Provider, MD   fexofenadine-pseudoephedrine (ALLEGRA-D 24) 180-240 mg per 24 hr tablet Take 1 tablet by mouth daily. FOR ALLERGIES  08/01/07 06/03/12 Yes Historical Provider, MD     Allergies   Allergen Reactions   ??? Latex, Natural Rubber      LATEX TAPE   ??? Other Itching and Swelling     Social history- reports that she has never smoked. She has never used smokeless tobacco. She reports that she does not drink alcohol or use illicit drugs.    ROS:   Review of Systems   Constitutional: Positive for fatigue. Negative for fever.   Gastrointestinal: Positive for diarrhea. Negative for nausea, abdominal pain, blood in stool and rectal pain.   Neurological: Negative for dizziness.   Psychiatric/Behavioral: Positive for dysphoric mood. The patient is not nervous/anxious.           Objective:   Physical Exam   Constitutional: She is oriented to person, place, and time. She appears well-developed and well-nourished.   HENT:   Head: Normocephalic.   Neck: Neck  supple.   Cardiovascular: Normal rate, regular rhythm and normal heart sounds.    No murmur heard.  Pulmonary/Chest: Effort normal and breath sounds normal. No respiratory distress. She has no wheezes. She has no rales.   Abdominal: Soft. She exhibits no mass. Bowel sounds are increased. There is no hepatosplenomegaly. There is no tenderness. There is no rebound and no guarding.   Musculoskeletal: She exhibits no edema.   Neurological: She is alert and oriented to person, place, and time.   Skin: Skin is warm and dry.   Psychiatric: She has a normal mood and affect. Her behavior is normal. Judgment and thought content normal.         Filed Vitals:    06/03/12 0833   BP: 120/72   Pulse: 64   Weight: 133 lb (60.328 kg)     Body mass index is 21.79 kg/(m^2).  There is no height on file to calculate BSA.    Assessment/Plan:     Amy Mcmillan was seen today for diarrhea and depression.    Diagnoses and associated orders for this visit:    Chronic diarrhea  - Recommended increased  calories, protein. Add Vitamin D, B12 to improve nutrition and mood  -     Recommend referral back to GI. She will check her insurance and schedule an appt.  -     CBC; Future  - Differential; Future  - Comprehensive Metabolic Panel, Serum; Future    Allergic rhinitis, cause unspecified  - fexofenadine-pseudoephedrine (ALLEGRA-D 24) 180-240 mg per 24 hr tablet; Take 1 tablet by mouth daily. FOR ALLERGIES  F/u for PE

## 2012-06-03 NOTE — Unmapped (Signed)
Vitamin D 4000 IU a day

## 2012-06-20 ENCOUNTER — Inpatient Hospital Stay: Admit: 2012-06-20 | Payer: PRIVATE HEALTH INSURANCE

## 2012-06-20 LAB — CHROMOGRANIN A: Chromogranin A: 63 nmol/L (ref 0–5)

## 2012-06-20 LAB — GLIADIN ANTIBODIES, SERUM
Gliadin IgA: 3 units (ref 0–19)
Gliadin IgG: 1 units (ref 0–19)

## 2012-06-20 LAB — TISSUE TRANSGLUTAMINASE, IGA: Transglutaminase IgA: <2 U/mL (ref 0–3)

## 2012-06-20 LAB — IGA: IgA: 131 mg/dL (ref 82.0–453.0)

## 2012-06-20 LAB — GASTRIN: Gastrin: 2031 pg/mL (ref 0–115)

## 2012-06-20 LAB — CALCITONIN: Calcitonin: <2 pg/mL (ref 0.0–5.0)

## 2012-06-21 ENCOUNTER — Inpatient Hospital Stay: Admit: 2012-06-21 | Payer: PRIVATE HEALTH INSURANCE

## 2012-06-21 LAB — FECAL FAT, QUALITATIVE
Fat Qual Neutral, Stl: NORMAL
Fat Quant, Stl: NORMAL

## 2012-06-21 LAB — CLOSTRIDIUM DIFFICILE DNA AMPLIFICATION: Clost. Diff DNA Amp.: NEGATIVE

## 2012-08-02 ENCOUNTER — Ambulatory Visit: Admit: 2012-08-02 | Payer: PRIVATE HEALTH INSURANCE

## 2012-08-02 DIAGNOSIS — D379 Neoplasm of uncertain behavior of digestive organ, unspecified: Secondary | ICD-10-CM

## 2012-08-02 NOTE — Unmapped (Signed)
Subjective  HPI:   Patient ID: Amy Mcmillan is a 62 y.o. female.    Chief Complaint:  HPI     I was asked to perform a pre-operative risk assessment for this patient for an ultrasound guided endoscopy at [facility] on  08/09/12 with Dr Augusto Gamble.  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  She had an EGD which showed multiple ulcerations. She is now on Omeprazole.  She denies previous problems with anesthesia.  She denies chest pain or SOB.  She is active, doing yard work for many hours without feeling tired. She is limited by spinal stenosis.  She denies problems with bleeding or healing.     Past medical history, medications, allergies and problem visit reviewed prior to visit.  Past Medical History   Diagnosis Date   ??? Spinal stenosis    ??? Breast cancer 03/2008     s/p XRT/Chemo   ??? IBS (irritable bowel syndrome)    ??? CTS (carpal tunnel syndrome)      LEFT HAND     Past Surgical History   Procedure Laterality Date   ??? Btl     ??? Breast lumpectomy Right 04/2008     Negative   ??? Bunionectomy bilateral     ??? Breast lumpectomy Left 2009     Breast Cancer     Family History   Problem Relation Age of Onset   ??? Colon polyps Sister    ??? Prostate cancer Brother    ??? Melanoma Neg Hx    ??? Uterine cancer Mother    ??? Colon cancer Cousin       Prior to Admission medications    Medication Sig Start Date End Date Taking? Authorizing Provider   cetirizine (ZYRTEC) 10 MG tablet Take 10 mg by mouth daily.   Yes Historical Provider, MD   omeprazole (PRILOSEC) 40 MG capsule Take 40 mg by mouth daily.   Yes Historical Provider, MD     Allergies   Allergen Reactions   ??? Latex, Natural Rubber      LATEX TAPE   ??? Other Itching and Swelling     HAIR DYE     Social history- reports that she has never smoked. She has never used smokeless tobacco. She reports that she does not drink alcohol or use illicit drugs.   ROS:   Review of Systems   Constitutional:  Negative for fever and fatigue.   Respiratory: Negative for cough, chest tightness and shortness of breath.    Cardiovascular: Negative for chest pain, palpitations and leg swelling.   Gastrointestinal: Negative for abdominal pain.   Musculoskeletal: Negative for myalgias.          Objective:   Physical Exam   Constitutional: She is oriented to person, place, and time. She appears well-developed and well-nourished.   HENT:   Head: Normocephalic.   Mouth/Throat: Uvula is midline, oropharynx is clear and moist and mucous membranes are normal.   Neck: Neck supple.   Cardiovascular: Normal rate, regular rhythm and normal heart sounds.    No murmur heard.  Pulmonary/Chest: Effort normal and breath sounds normal. No respiratory distress. She has no wheezes. She has no rales.   Abdominal: Soft. Bowel sounds are normal. She exhibits no mass. There is no hepatosplenomegaly. There is no tenderness. There is no rebound and no guarding.   Musculoskeletal: She exhibits no edema.  Neurological: She is alert and oriented to person, place, and time.   Skin: Skin is warm and dry.   Psychiatric: She has a normal mood and affect. Her behavior is normal. Judgment and thought content normal.         Filed Vitals:    08/02/12 1443   BP: 130/58   Pulse: 68   Temp: 99 ??F (37.2 ??C)   TempSrc: Oral   Height: 5' 5 (1.651 m)   Weight: 143 lb (64.864 kg)     Body mass index is 23.8 kg/(m^2).  Body surface area is 1.73 meters squared.    Assessment/Plan:     Caelen was seen today for pre-op exam.    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   Patient is medically stable for the proposed procedure.   Estimated A.S.A Class: 1a  Recommendation/Plan: okay to proceed with procedure  Risks of surgery pertaining to above identified problems discussed with pt/guardian.   Pt/guardian agrees to follow through with above prescribed testing and/or  medication changes (and appropriate testing f/u) prior to procedure.   Consultation faxed to surgeon's office.

## 2012-10-14 ENCOUNTER — Ambulatory Visit: Admit: 2012-10-14 | Payer: PRIVATE HEALTH INSURANCE

## 2012-10-14 DIAGNOSIS — B354 Tinea corporis: Secondary | ICD-10-CM

## 2012-10-14 MED ORDER — ketoconazole (NIZORAL) 2 % cream
2 | Freq: Every day | TOPICAL | 1.00 refills | 30.00000 days | Status: AC
Start: 2012-10-14 — End: 2014-08-19

## 2012-10-14 NOTE — Unmapped (Signed)
Subjective  HPI:   Patient ID: Amy Mcmillan is a 62 y.o. female.    Chief Complaint:  HPI     She has a lesion on her right forearm. She was in [place], 9 days ago. Initially she had a black spot that was very itchy, she thought it was a bug bite. Last week, it became red, raised and painful. She has put cortaid on it with some relief. She also took an aleve.     Past medical history, medications, allergies and problem visit reviewed prior to visit.  Past Medical History   Diagnosis Date   ??? Spinal stenosis    ??? Breast cancer 03/2008     s/p XRT/Chemo   ??? IBS (irritable bowel syndrome)    ??? CTS (carpal tunnel syndrome)      LEFT HAND     Past Surgical History   Procedure Laterality Date   ??? Btl     ??? Breast lumpectomy Right 04/2008     Negative   ??? Bunionectomy bilateral     ??? Breast lumpectomy Left 2009     Breast Cancer     Family History   Problem Relation Age of Onset   ??? Colon polyps Sister    ??? Prostate cancer Brother    ??? Melanoma Neg Hx    ??? Uterine cancer Mother    ??? Colon cancer Cousin       Prior to Admission medications    Medication Sig Start Date End Date Taking? Authorizing Provider   cetirizine (ZYRTEC) 10 MG tablet Take 10 mg by mouth daily.   Yes Historical Provider, MD   omeprazole (PRILOSEC) 40 MG capsule Take 40 mg by mouth daily.   Yes Historical Provider, MD     Allergies   Allergen Reactions   ??? Latex, Natural Rubber Itching     LATEX TAPE   ??? Other Itching and Swelling     HAIR DYE: blisters     Social history- reports that she has never smoked. She has never used smokeless tobacco. She reports that she does not drink alcohol or use illicit drugs.   ROS:   Review of Systems   Constitutional: Negative for fever and fatigue.   Musculoskeletal: Negative for myalgias and arthralgias.   Skin: Positive for color change and rash.     Objective:   Physical Exam   Constitutional: She is oriented to person, place, and time. She appears well-developed and well-nourished.   HENT:   Head: Normocephalic.    Neck: Neck supple.   Cardiovascular: Normal rate, regular rhythm and normal heart sounds.    No murmur heard.  Pulmonary/Chest: Effort normal and breath sounds normal. No respiratory distress. She has no wheezes. She has no rales.   Musculoskeletal: She exhibits no edema.   Neurological: She is alert and oriented to person, place, and time.   Skin: Skin is warm and dry.        Psychiatric: She has a normal mood and affect. Her behavior is normal. Judgment and thought content normal.         Filed Vitals:    10/14/12 1124   BP: 144/82   Pulse: 56   Height: 5' 5 (1.651 m)   Weight: 141 lb (63.957 kg)     Body mass index is 23.46 kg/(m^2).  Body surface area is 1.71 meters squared.    Assessment/Plan:     Amy Mcmillan was seen today for insect bite.    Diagnoses  and associated orders for this visit:    Tinea corporis  - Treat with ketoconazole (NIZORAL) 2 % cream; Apply topically daily.  Follow-up if no improvement or worsening of symptoms.

## 2013-02-17 ENCOUNTER — Ambulatory Visit: Admit: 2013-02-17 | Payer: PRIVATE HEALTH INSURANCE

## 2013-02-17 DIAGNOSIS — K219 Gastro-esophageal reflux disease without esophagitis: Secondary | ICD-10-CM

## 2013-02-17 MED ORDER — omeprazole (PRILOSEC) 40 MG capsule
40 | ORAL_CAPSULE | Freq: Every day | ORAL | Status: AC
Start: 2013-02-17 — End: 2014-02-23

## 2013-02-17 NOTE — Unmapped (Addendum)
Try Nasacort Aq each nostril once a day.    Immunization History   Administered Date(s) Administered   ??? Meningococcal Polysaccharide MPSV4 02/08/1994   ??? Td 02/22/1994, 08/18/2003   ??? Zoster 02/17/2013

## 2013-02-17 NOTE — Unmapped (Signed)
Subjective  HPI:   Patient ID: Amy Mcmillan is a 62 y.o. female.    Chief Complaint:  HPI      She is here to get her shingles vaccine.  She had lab work at [facility] for Dr. Selena Batten, her breast cancer doctor. Her Gastrin levels are high.  She will have them repeated tomorrow because she wasn't fasting. She reports having testing for Gastrinoma in the past which were negative. Her serotinin and Chromanigin levels are also elevated.   This past spring, her 24 hour urine tests were also elevated for serotonin.    She does have GERD and has been off of Omeprazole.  She would like to restart this.      Past medical history, medications, allergies and problem visit reviewed prior to visit.  Past Medical History   Diagnosis Date   ??? Spinal stenosis    ??? Breast cancer 03/2008     s/p XRT/Chemo   ??? IBS (irritable bowel syndrome)    ??? CTS (carpal tunnel syndrome)      LEFT HAND     Past Surgical History   Procedure Laterality Date   ??? Btl     ??? Breast lumpectomy Right 04/2008     Negative   ??? Bunionectomy bilateral     ??? Breast lumpectomy Left 2009     Breast Cancer     Family History   Problem Relation Age of Onset   ??? Colon polyps Sister    ??? Prostate cancer Brother    ??? Melanoma Neg Hx    ??? Uterine cancer Mother    ??? Colon cancer Cousin       Prior to Admission medications    Medication Sig Start Date End Date Taking? Authorizing Provider   cetirizine (ZYRTEC) 10 MG tablet Take 10 mg by mouth daily.   Yes Historical Provider, MD     Allergies   Allergen Reactions   ??? Latex, Natural Rubber Itching     LATEX TAPE   ??? Other Itching and Swelling     HAIR DYE: blisters     Social history- reports that she has never smoked. She has never used smokeless tobacco. She reports that she does not drink alcohol or use illicit drugs.      ROS:   Review of Systems   Constitutional: Negative for fever and fatigue.   Gastrointestinal: Positive for abdominal pain and diarrhea. Negative for nausea, vomiting and constipation.   Musculoskeletal:  Negative for arthralgias and myalgias.          Objective:   Physical Exam   Constitutional: She is oriented to person, place, and time. She appears well-developed and well-nourished.   HENT:   Head: Normocephalic.   Neck: Neck supple.   Cardiovascular: Normal rate, regular rhythm and normal heart sounds.    No murmur heard.  Pulmonary/Chest: Effort normal and breath sounds normal. No respiratory distress. She has no wheezes. She has no rales.   Abdominal: Soft. Bowel sounds are normal. She exhibits no mass. There is no hepatosplenomegaly. There is no tenderness. There is no rebound and no guarding.   Musculoskeletal: She exhibits no edema.   Neurological: She is alert and oriented to person, place, and time.   Skin: Skin is warm and dry.   Psychiatric: She has a normal mood and affect. Her behavior is normal. Judgment and thought content normal.         Filed Vitals:    02/17/13 1047  BP: 116/60   Pulse: 68   Temp: 97.7 ??F (36.5 ??C)   TempSrc: Oral   Height: 5' 5 (1.651 m)   Weight: 154 lb (69.854 kg)     Body mass index is 25.63 kg/(m^2).  Body surface area is 1.79 meters squared.    Assessment/Plan:     Amy Mcmillan was seen today for no specified reason.    Diagnoses and associated orders for this visit:    GERD (gastroesophageal reflux disease)  - omeprazole (PRILOSEC) 40 MG capsule; Take 1 capsule (40 mg total) by mouth daily.    Possible Carcinoid Tumor  -    Reviewed lab results from [facility], will await further testing.    Other Orders  - Varicella Zoster vaccine SQ (ZOSTAVAX)  F/u prn

## 2013-11-27 ENCOUNTER — Ambulatory Visit: Admit: 2013-11-27 | Discharge: 2013-11-27 | Payer: PRIVATE HEALTH INSURANCE

## 2013-11-27 DIAGNOSIS — M25511 Pain in right shoulder: Secondary | ICD-10-CM

## 2013-11-27 NOTE — Unmapped (Signed)
Subjective  HPI:   Patient ID: Amy Mcmillan is a 63 y.o. female.    Chief Complaint:  HPI     She has a sharp, abrupt pain in her right shoulder, axillary area that comes on when she raises her arm overhead.  It feels like a nerve pain.  She was writing yesterday, with her right hand and it came on suddenly, felt like a zing.  She has a lump over the right shoulder for years, no change in size. She is concerned that this is compressing a nerve.      Past medical history, medications, allergies and problem visit reviewed prior to visit.  Past Medical History   Diagnosis Date   ??? Spinal stenosis    ??? Breast cancer 03/2008     s/p XRT/Chemo   ??? IBS (irritable bowel syndrome)    ??? CTS (carpal tunnel syndrome)      LEFT HAND     Past Surgical History   Procedure Laterality Date   ??? Btl     ??? Breast lumpectomy Right 04/2008     Negative   ??? Bunionectomy bilateral     ??? Breast lumpectomy Left 2009     Breast Cancer     Family History   Problem Relation Age of Onset   ??? Colon polyps Sister    ??? Prostate cancer Brother    ??? Melanoma Neg Hx    ??? Uterine cancer Mother    ??? Colon cancer Cousin       Home Meds the Patient Reports Taking   Medication Sig   ??? cetirizine (ZYRTEC) 10 MG tablet Take 10 mg by mouth daily.   ??? omeprazole (PRILOSEC) 40 MG capsule Take 1 capsule (40 mg total) by mouth daily.   ??? tamoxifen (NOLVADEX) 10 MG tablet Take by mouth. Take half tablet  daily     No Facility-Administered Medications for the 11/27/13 encounter (Office Visit) with Amy Mcmillan. Amy Singer, MD.     Allergies   Allergen Reactions   ??? Latex, Natural Rubber Itching     LATEX TAPE   ??? Other Itching and Swelling     HAIR DYE: blisters     History     Social History   ??? Marital Status: Married     Spouse Name: Amy Mcmillan     Number of Children: 1   ??? Years of Education: N/A     Occupational History   ??? Retired Corporate investment banker at school      Social History Main Topics   ??? Smoking status: Never Smoker    ??? Smokeless tobacco: Never Used       Comment: 06/14/10   ??? Alcohol Use: No      Comment: 03/01/07   ??? Drug Use: No      Comment: 01/31/10   ??? Sexual Activity: Yes     Other Topics Concern   ??? None     Social History Narrative    BROTHER(2)- Esophageal constriction     Sister (2) TAH        ROS:   Review of Systems   Constitutional: Negative for fatigue.   Musculoskeletal: Positive for arthralgias and myalgias.   Neurological: Negative for weakness and numbness.     Objective:   Physical Exam   Constitutional: She is oriented to person, place, and time. She appears well-developed and well-nourished.   HENT:   Head: Normocephalic.   Neck: Neck supple.   Cardiovascular: Normal rate,  regular rhythm and normal heart sounds.    No murmur heard.  Pulmonary/Chest: Effort normal and breath sounds normal. No respiratory distress. She has no wheezes. She has no rales.   Musculoskeletal: She exhibits no edema.        Right shoulder: She exhibits normal range of motion, no swelling and no pain.        Arms:  Neurological: She is alert and oriented to person, place, and time.   Skin: Skin is warm and dry.   Psychiatric: She has a normal mood and affect. Her behavior is normal. Judgment and thought content normal.         Filed Vitals:    11/27/13 1000   BP: 130/88   Pulse: 68   Height: 5' 5 (1.651 m)   Weight: 154 lb (69.854 kg)     Body mass index is 25.63 kg/(m^2).  Body surface area is 1.79 meters squared.     Assessment/Plan:     [name] was seen today for arm pain.    Diagnoses and associated orders for this visit:    Right shoulder pain  - Unable to reproduce pain at visit. Mass on right shoulder possible lipoma, concern for nerve compression. Continue Aleve prn.  - Amb referral to Orthopedic Surgery for evaluation.  F/u prn

## 2013-12-10 ENCOUNTER — Inpatient Hospital Stay: Admit: 2013-12-10 | Payer: PRIVATE HEALTH INSURANCE | Attending: Sports Medicine

## 2013-12-10 ENCOUNTER — Ambulatory Visit: Admit: 2013-12-10 | Discharge: 2013-12-10 | Payer: PRIVATE HEALTH INSURANCE | Attending: Sports Medicine

## 2013-12-10 DIAGNOSIS — M19019 Primary osteoarthritis, unspecified shoulder: Secondary | ICD-10-CM

## 2013-12-10 DIAGNOSIS — M25511 Pain in right shoulder: Secondary | ICD-10-CM

## 2013-12-10 NOTE — Unmapped (Signed)
This office note has been dictated.

## 2013-12-10 NOTE — Unmapped (Signed)
[facility]     PATIENT NAME: Amy Mcmillan, Amy Mcmillan                MRN: [id number]  DATE OF BIRTH: 07/07/1950                      CSN: [id number]  PROVIDER: Orion Crook. Kem Kays, M.D.                 VISIT DATE: 12/10/2013                                NEW PATIENT OFFICE VISIT     CHIEF COMPLAINT:  Right shoulder pain.     Amy Mcmillan is a 63 year old female who has had right shoulder pain for nearly a  week.  She actually reports that over the last two days it has gotten  significantly better and is nearly gone.  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  This is  something that is known to her.     She reports sometimes reports that it is a nerve like pain, although, again  it is completely resolved.     Past medical history, medications, allergies and review of systems are per  the medical template entered into Epic.     On physical exam, full forward flexion.  No scapular dysrhythmia.  5/5  rotator cuff strength in all planes.  Normal neurovascular exam distally.  She does have a relatively large lipoma overlying her acromion.     X-rays to demonstrate the soft tissue lipoma.  No other osseous or joint  abnormalities.     IMPRESSION AND PLAN:  Right shoulder pain, resolved.  Today I discussed with  her that I am not very concerned with the possible nerve pain that she was  having.  Currently she has good 5/5 rotator cuff strength without any signs  of impingement.  She also has no signs of arthritis on her x-ray.  I would  like her to be activity as tolerated and follow up with me as needed.                                              Orion Crook. Kem Kays, M.D.  BMG/fm  D:  12/10/2013 16:29  T:  12/11/2013 12:59  Job #:  [id number]           NEW PATIENT OFFICE VISIT                                     PAGE    1 of   1

## 2014-02-24 NOTE — Unmapped (Signed)
LV 11/27/13

## 2014-02-27 MED ORDER — omeprazole (PRILOSEC) 40 MG capsule
40 | ORAL_CAPSULE | ORAL | Status: AC
Start: 2014-02-27 — End: ?

## 2014-02-27 NOTE — Unmapped (Signed)
ERROR ALREADY ORDERED ON NOV 16

## 2014-02-27 NOTE — Unmapped (Signed)
Please see attached request for refill.

## 2014-08-19 ENCOUNTER — Ambulatory Visit: Admit: 2014-08-19 | Discharge: 2014-08-19 | Payer: PRIVATE HEALTH INSURANCE

## 2014-08-19 DIAGNOSIS — J069 Acute upper respiratory infection, unspecified: Secondary | ICD-10-CM

## 2014-08-19 MED ORDER — codeine-guaifenesin (ROBITUSSIN AC) 10-100 mg/5 mL syrup
10-100 | Freq: Four times a day (QID) | ORAL | Status: AC | PRN
Start: 2014-08-19 — End: ?

## 2014-08-19 NOTE — Unmapped (Signed)
History of cough all night, congestion, rhinorhea, sore throat, chest congestion, achy and tired  Duration: 6 days  No fevers, chills, nausea, vomiting, shortness of breath, or chest pains. Decreased appetite   treatments tried: mucinex D  Sick contacts: yes     Patient Active Problem List   Diagnosis   ??? Malignant neoplasm of female breast   ??? Allergic rhinitis   ??? Other Acne   ??? Spinal Stenosis of Lumbar Region   ??? Right shoulder pain         Home Meds the Patient Reports Taking   Medication Sig   ??? b complex vitamins capsule Take 1 capsule by mouth daily.   ??? cetirizine (ZYRTEC) 10 MG tablet Take 10 mg by mouth daily.   ??? cholecalciferol, vitamin D3, 400 unit Cap Take 1 tablet by mouth daily.   ??? omeprazole (PRILOSEC) 40 MG capsule TAKE ONE CAPSULE BY MOUTH DAILY   ??? tamoxifen (NOLVADEX) 10 MG tablet Take by mouth. Take half tablet  daily        Allergies   Allergen Reactions   ??? Benzoin Tincture Plain Itching     And rash   ??? Adhesive Tape-Silicones      Steri-strips- caused itching and skin discoloration   ??? Iodinated Contrast Media - Iv Dye Itching   ??? Latex, Natural Rubber Itching     LATEX TAPE   ??? Other Itching and Swelling     HAIR DYE: blisters       Past Medical History   Diagnosis Date   ??? Spinal stenosis    ??? Breast cancer 03/2008     s/p XRT/Chemo   ??? IBS (irritable bowel syndrome)    ??? CTS (carpal tunnel syndrome)      LEFT HAND     Past Surgical History   Procedure Laterality Date   ??? Btl     ??? Breast lumpectomy Right 04/2008     Negative   ??? Bunionectomy bilateral     ??? Breast lumpectomy Left 2009     Breast Cancer     History     Social History   ??? Marital Status: Married     Spouse Name: Illene Labrador     Number of Children: 1   ??? Years of Education: N/A     Occupational History   ??? Retired Corporate investment banker at school      Social History Main Topics   ??? Smoking status: Never Smoker    ??? Smokeless tobacco: Never Used      Comment: 06/14/10   ??? Alcohol Use: No      Comment: 03/01/07   ??? Drug Use: No       Comment: 01/31/10   ??? Sexual Activity: Yes     Other Topics Concern   ??? Not on file     Social History Narrative    BROTHER(2)- Esophageal constriction     Sister (2) TAH     Family History   Problem Relation Age of Onset   ??? Colon polyps Sister    ??? Prostate cancer Brother    ??? Melanoma Neg Hx    ??? Uterine cancer Mother    ??? Colon cancer Cousin        ROS: No fever, chills, nausea, vomiting, shortness of breath, or chest pains.     Physical exam:  BP 154/82 mmHg   Pulse 84   Temp(Src) 98.2 ??F (36.8 ??C) (Oral)   Ht 5' 4 (1.626  m)   Wt 154 lb (69.854 kg)   BMI 26.42 kg/m2  General: Well appearing. No acute distress.  Skin: no rash or suspicious lesions  HEENT: PERRL, oral pharynx normal, TMs normal, no thyromegaly  Lymphatic: no LAD of anterior cervical and supraclavicular  Cardiac: regular rate and rhythm. No murmurs rubs or gallops.  Normal S1 and S2.  No palpable thrill  Respiratory: clear to auscultation bilateral.  No respiratory distress.  Normal palpation.  Psych: alert and oriented.  Normal mood and affect    Assessment and Plan:  URI: Viral.  Over the counter symptomatic relief. Follow-up for new or worsening symptons such as fever or shortness of breath.

## 2014-12-09 NOTE — Unmapped (Signed)
Patient is having surgery for ORI Right Tib Fx in [place].   They need last OV,Labs,EKG and any Cardio  Notes faxed to them .  Surgery date is 12/17/14.    Fax to [phone]    Printed and faxed

## 2021-02-28 IMAGING — MG MAMMO BREAST SCREENING TOMOSYNTHESIS BILATERAL
8 of 12 series · 8 of 28 positions shown · non-contrast
Comparison: 02/26/2020
Breast density: Heterogeneously dense which can limit the sensitivity of mammography (C)

This is a summary report. The complete report is available in the patient's medical record. If you cannot access the medical record, please contact the sending organization for a detailed fax or copy.
FINAL REPORT:
MAMMO BREAST SCREENING TOMOSYNTHESIS BILATERAL
INDICATION: 70-year-old female with history of right breast cancer status post lumpectomy and radiation in 7909.
TECHNIQUE: Bilateral digital CC and MLO images of the breasts were obtained with tomosynthesis. CAD was utilized.

[R CC synth-2D]
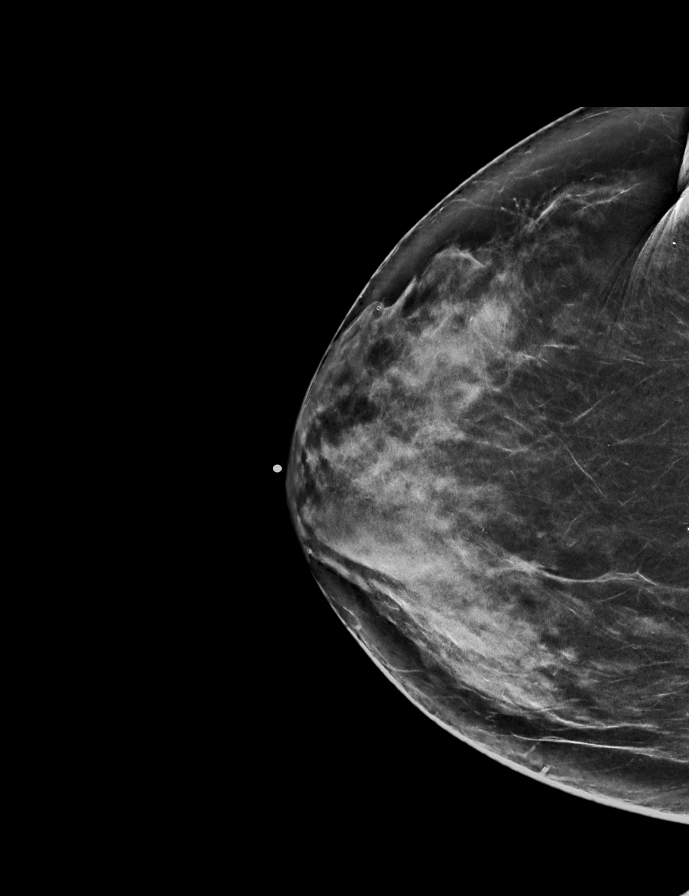

[L MLO synth-2D]
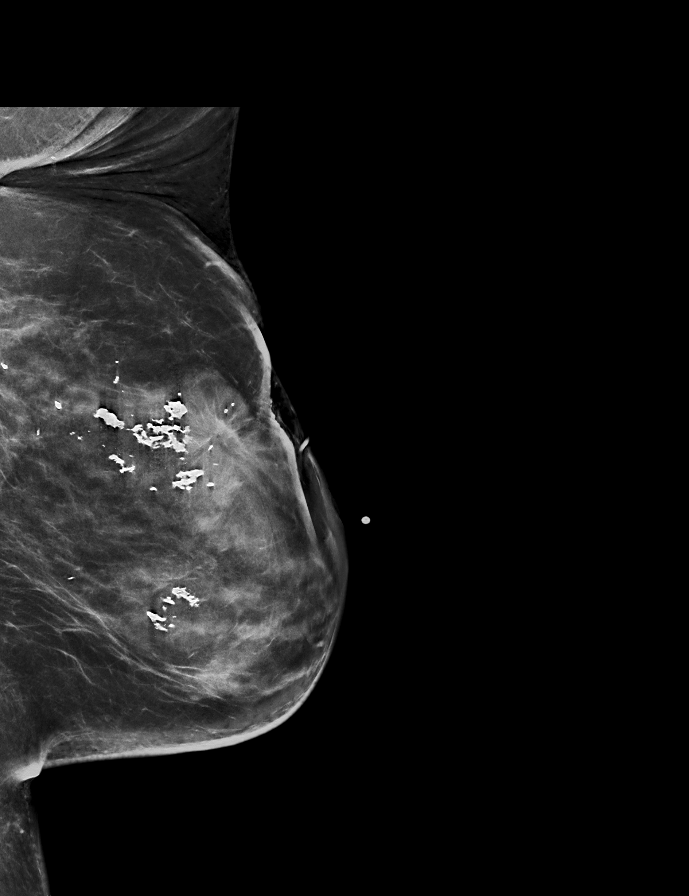

[R MLO]
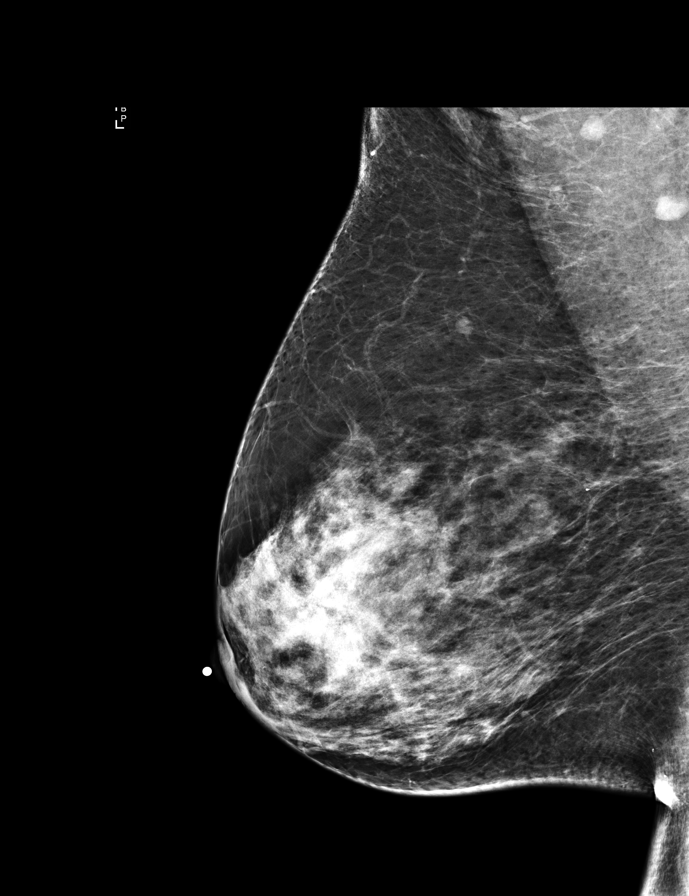

[L CC synth-2D]
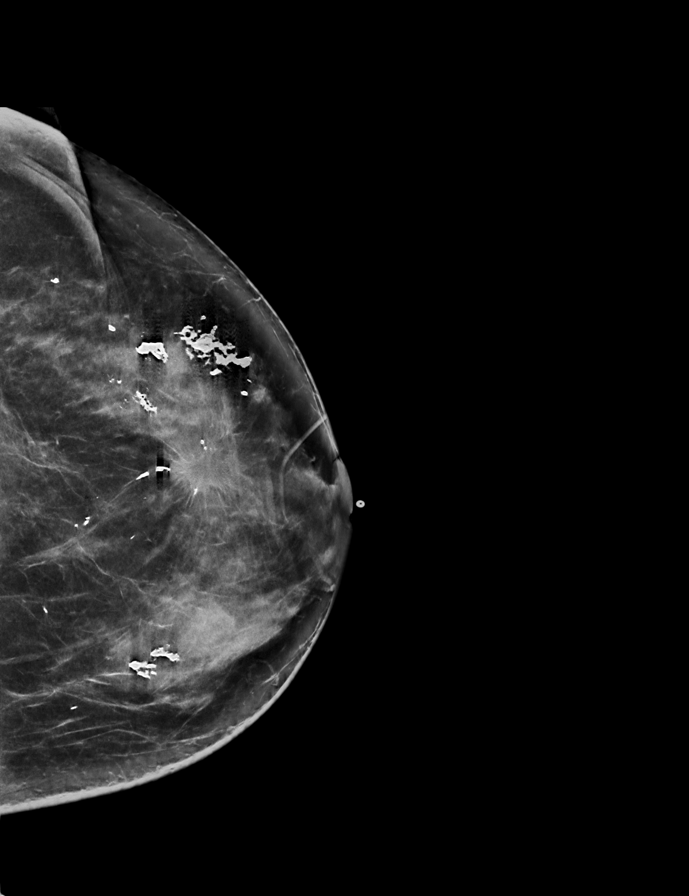

[L MLO]
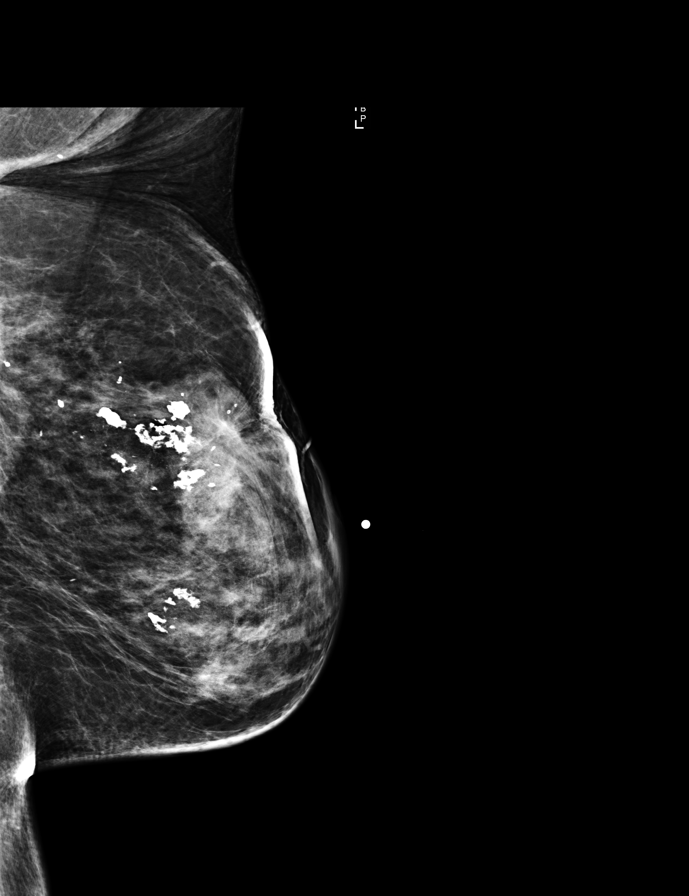

[L CC]
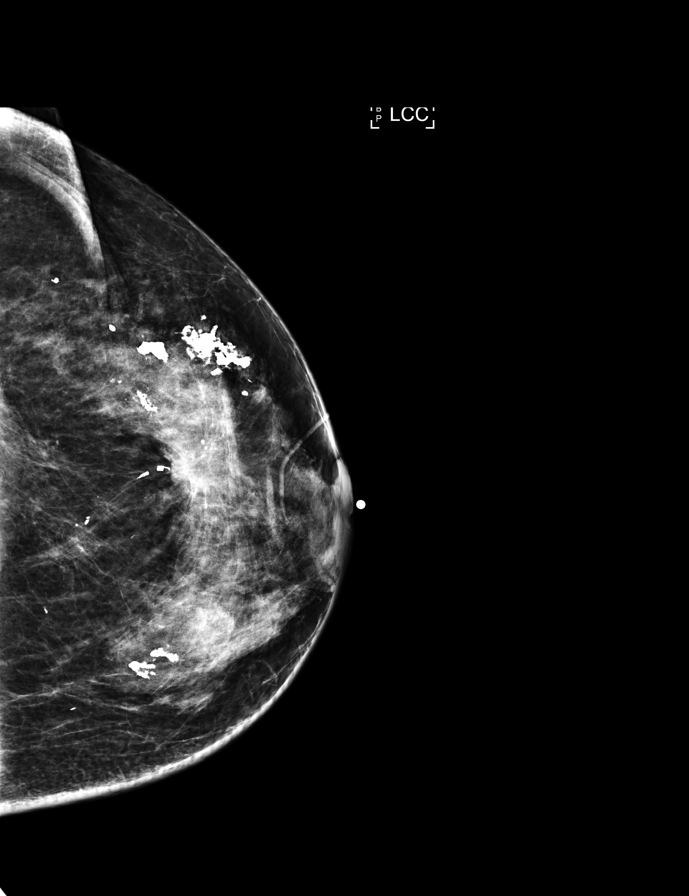

[R CC]
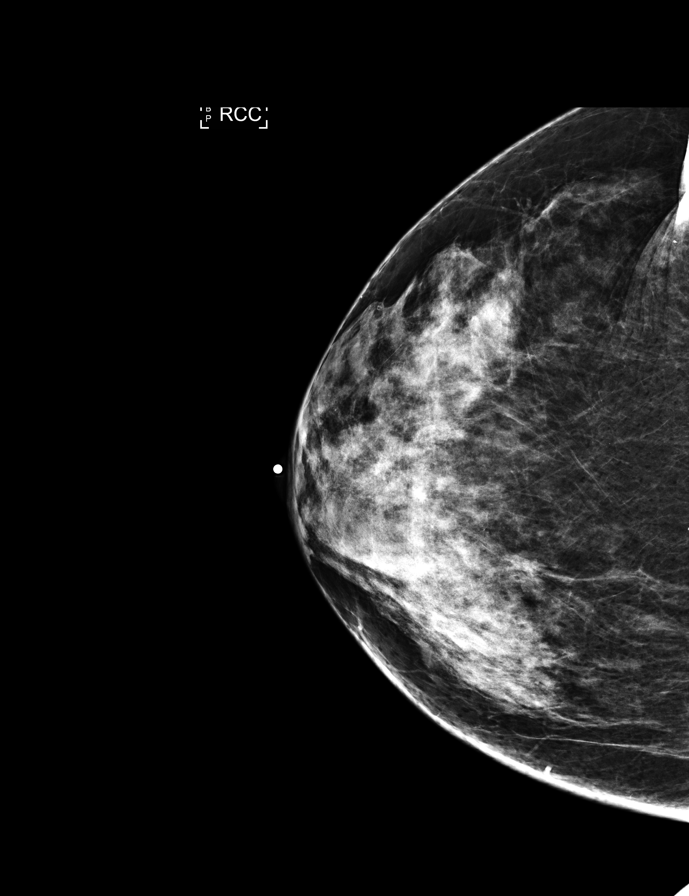

[R MLO synth-2D]
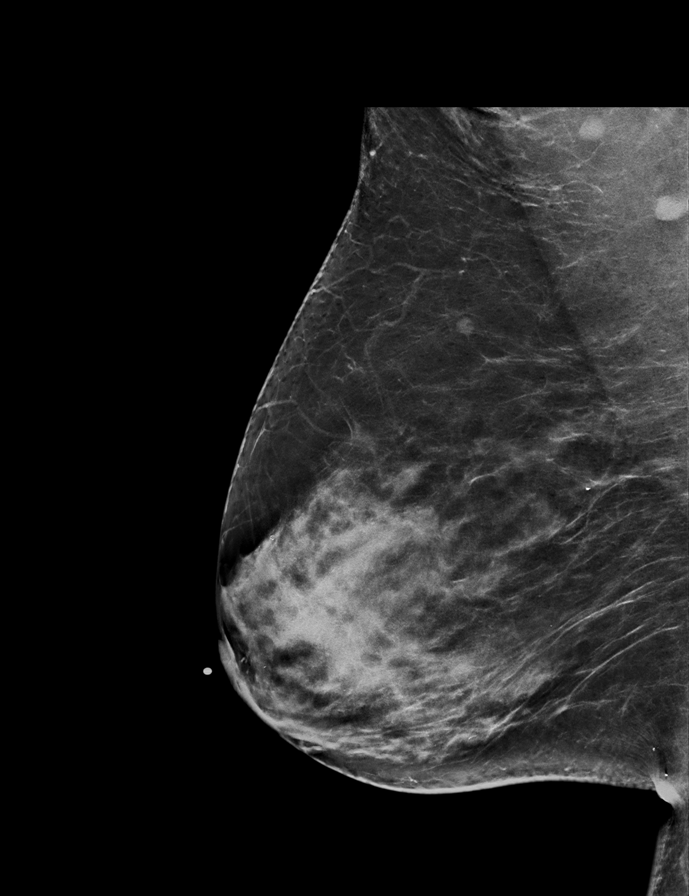

[8 of 28 positions shown; findings below may reference images not displayed]

FINDINGS: There are postsurgical changes from lumpectomy in the middle left breast near the [DATE] axis. There are dystrophic calcifications in the left breast. There is stable scarring in the right posterior outer quadrants. No suspicious masses or calcifications. No skin thickening or nipple retraction.
IMPRESSION: 
IMPRESSION: No mammographic evidence of malignancy.
Recommendations: Annual screening.
BI-RADS code: 2; benign findings
**One must recognize that there is as high as a 10-15% false-negative rate on a single screening mammogram. Current recommendations are for annual screening mammography from age 40 onwards.**
The patient will be entered into a reminder system with a target due date for the next mammogram.

## 2021-08-22 IMAGING — MG MAMMO BREAST DIAGNOSTIC TOMOSYNTHESIS 3D RIGHT
8 of 16 series · 8 of 36 positions shown · non-contrast
Comparison: [DATE]

This is a summary report. The complete report is available in the patient's medical record. If you cannot access the medical record, please contact the sending organization for a detailed fax or copy.
FINAL REPORT:
EXAM: MAMMO BREAST DIAGNOSTIC TOMOSYNTHESIS 3D RIGHT
CLINICAL HISTORY: Intermittent right breast pain with movement medial to lateral without focality. Not present currently. Patient due for annual exam.
TECHNIQUE: MLO and CC views of both breasts with tomosynthesis were obtained on a digital full-field mammographic unit. Additional straight lateral and spot compression CC and MLO views were obtained of the right breast. This examination was interpreted in conjunction with computer aided detection system.

[R XCCL]
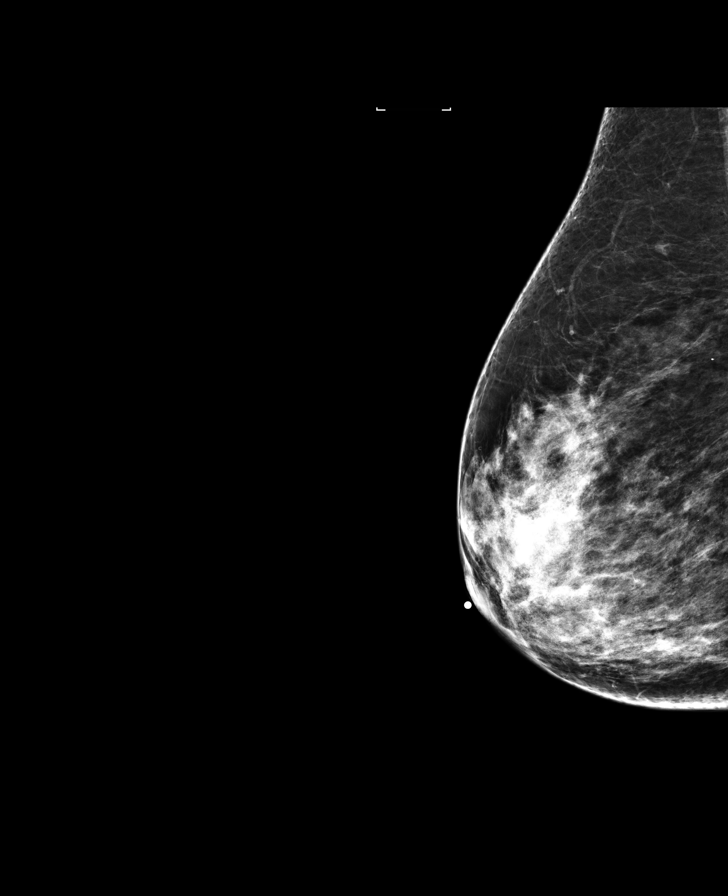

[R ML synth-2D]
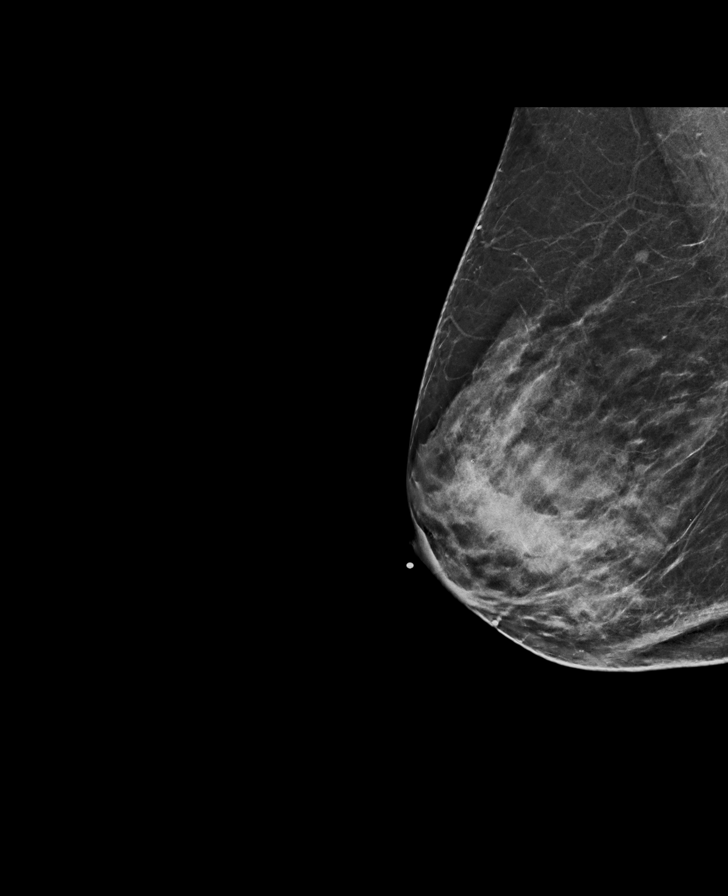

[R ML]
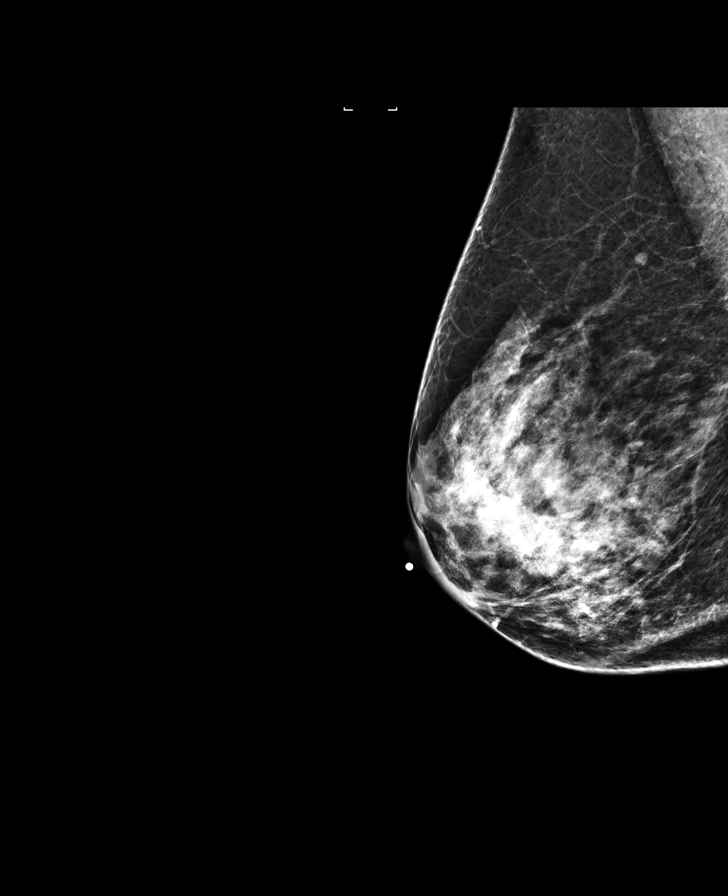

[R CC synth-2D]
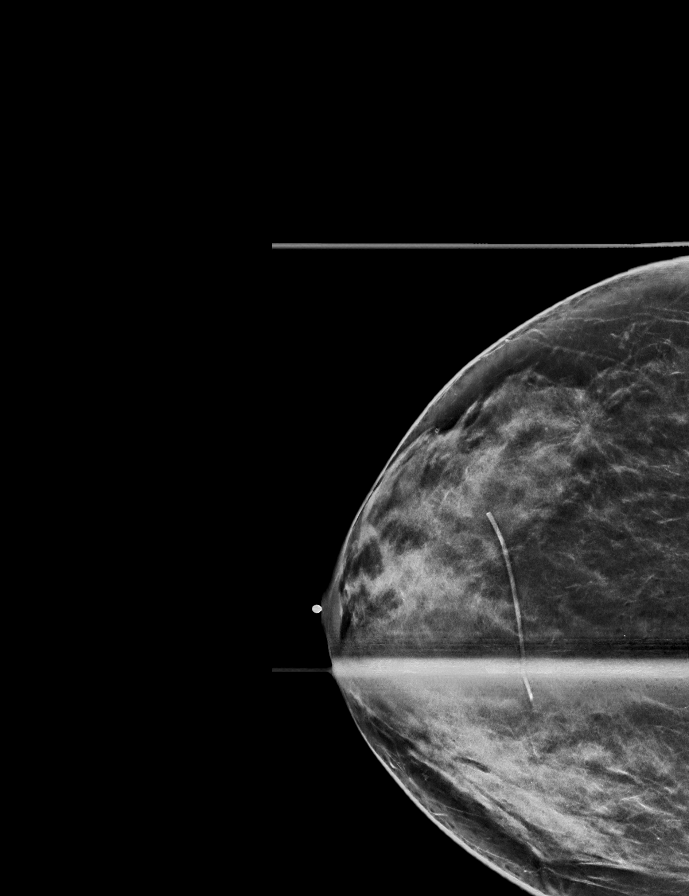

[R MLO]
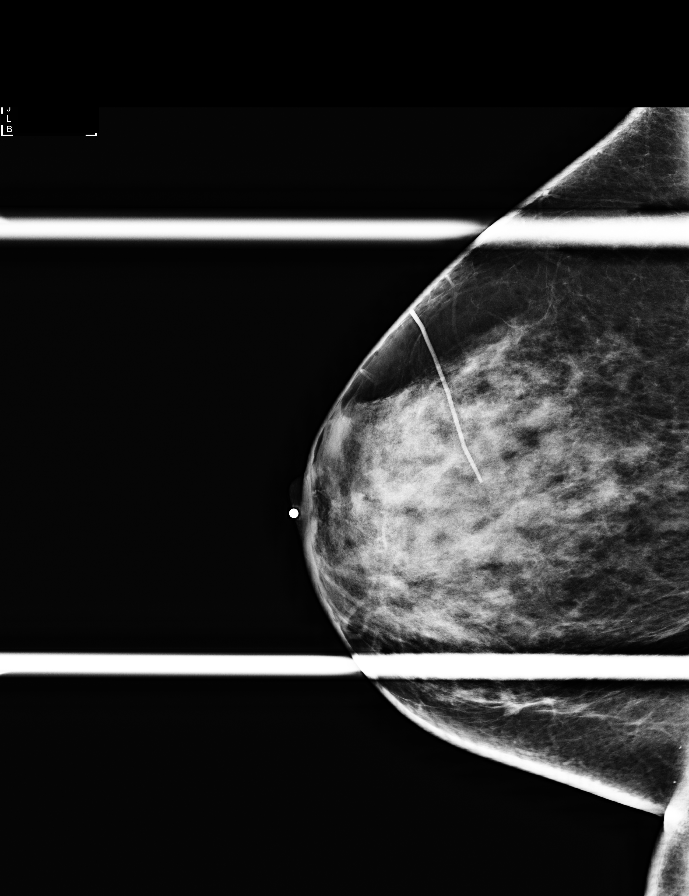

[R MLO synth-2D (1 of 2)]
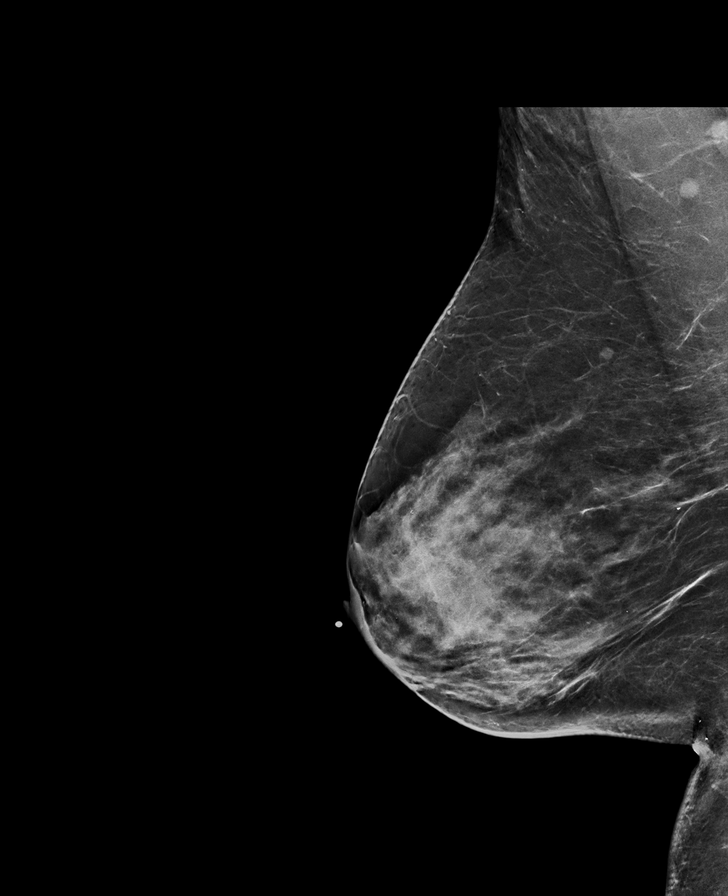

[R MLO synth-2D (2 of 2)]
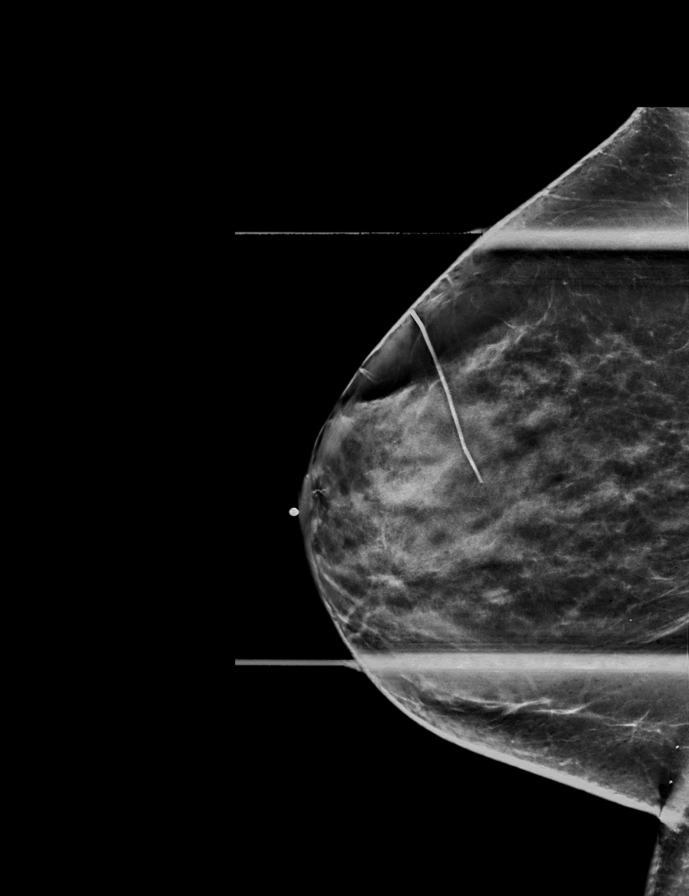

[R CC]
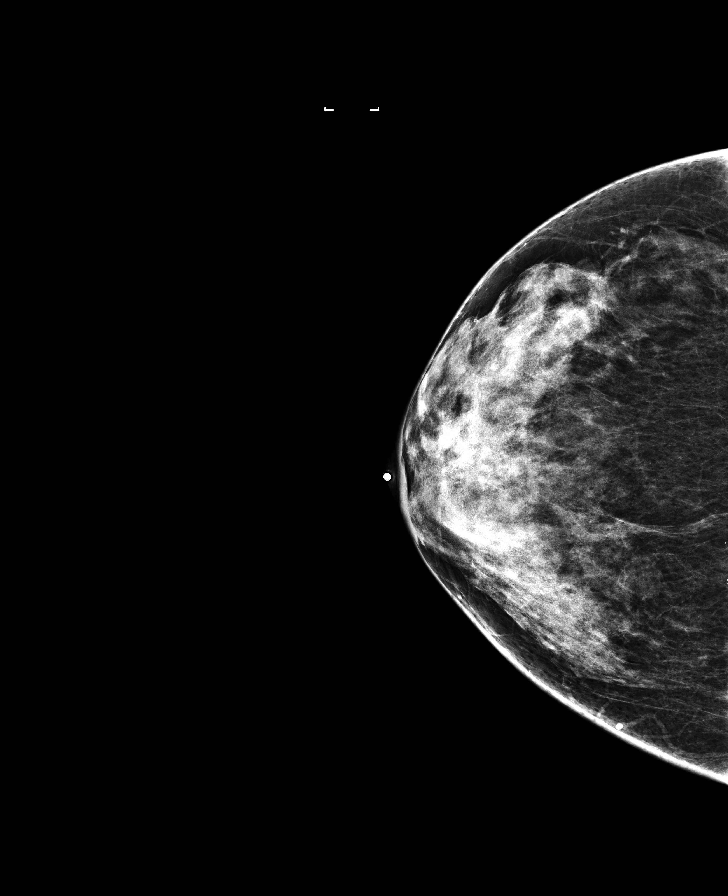

[8 of 36 positions shown; findings below may reference images not displayed]

FINDINGS: BREAST DENSITY TYPE B: There are scattered areas of fibroglandular density.
Asymmetry suggested at the outer right breast mid depth dissipates with compression on CC and MLO. Exam otherwise stable. The skin, nipples, and both axillae are unremarkable. There is no evidence of a dominant mass, suspicious cluster of calcifications, or architectural distortion in either breast.
IMPRESSION: No mammographic evidence of malignancy. Recommend clinical follow-up for the right breast pain.
BI-RADS Category 2: Benign
Recommendation(s): Bilateral screening mammogram in one year.

## 2021-08-23 IMAGING — US US UP EXT NONVAS RIGHT
1 series · 11 of 11 positions shown · non-contrast
Comparison: none

FINAL REPORT:
Limited ultrasound evaluation the area of palpable concern.
Grayscale and Doppler ultrasound were used to evaluate the area of concern.

[Series 1: us up ext nonvas right · 11 of 11 slices shown]
[im 1/11]
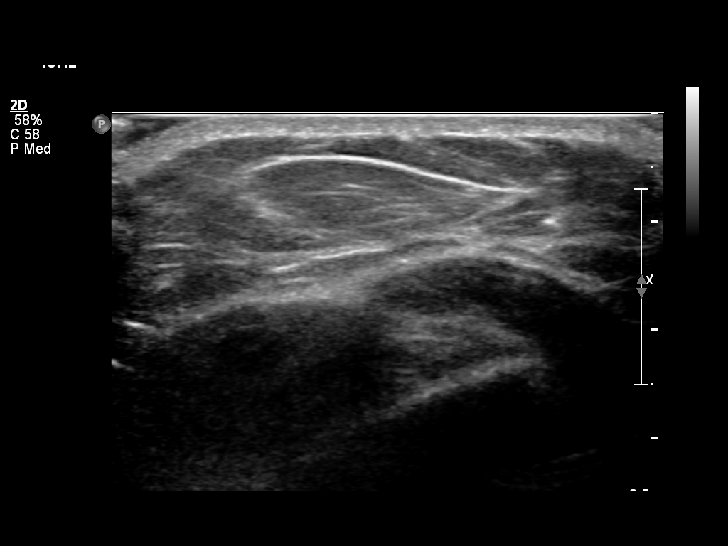
[im 2/11]
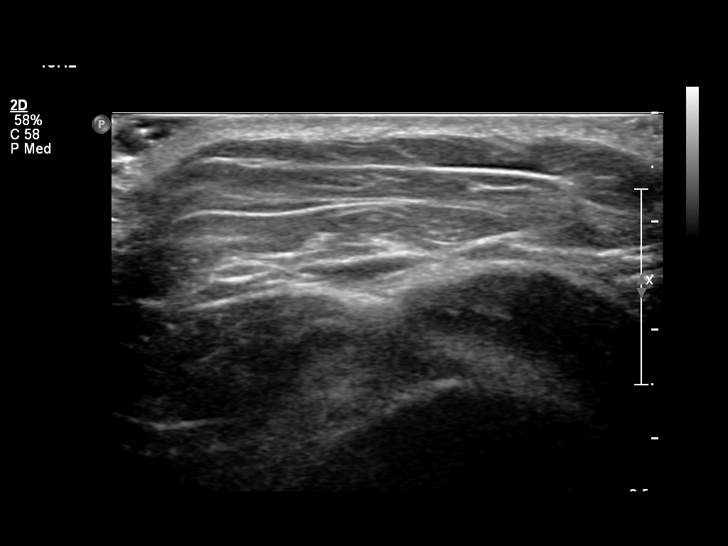
[im 3/11]
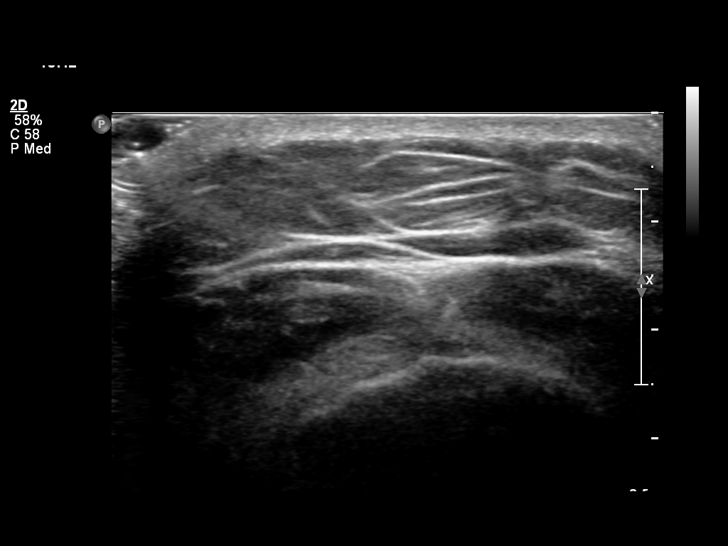
[im 4/11]
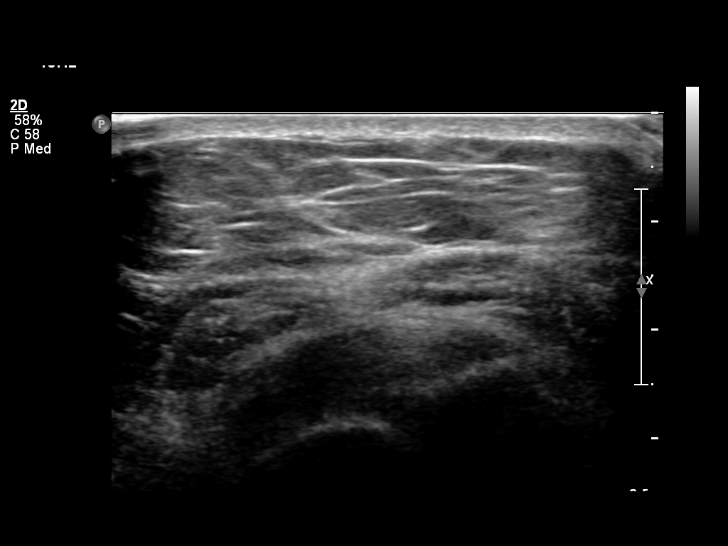
[im 5/11]
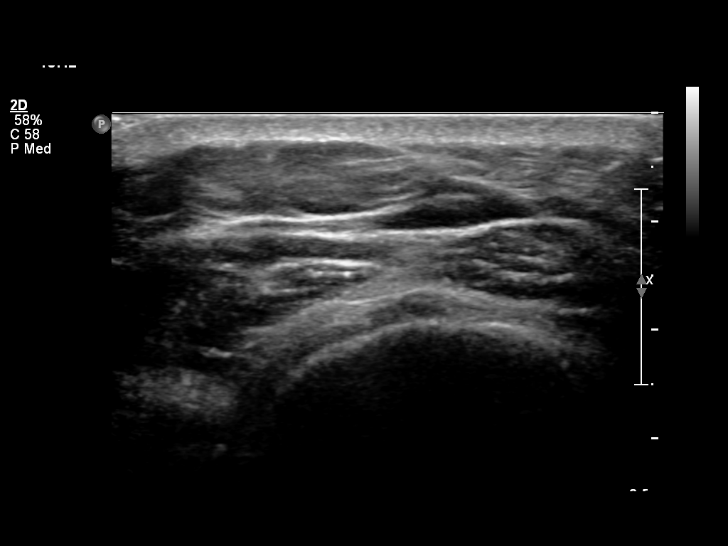
[im 6/11]
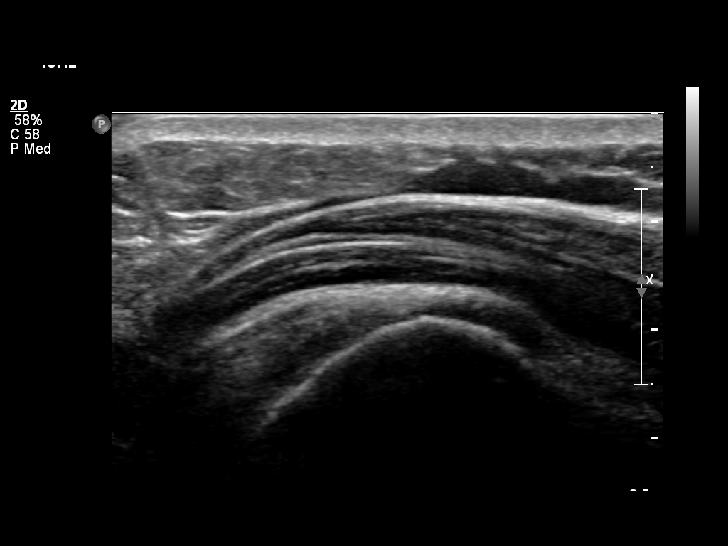
[im 7/11]
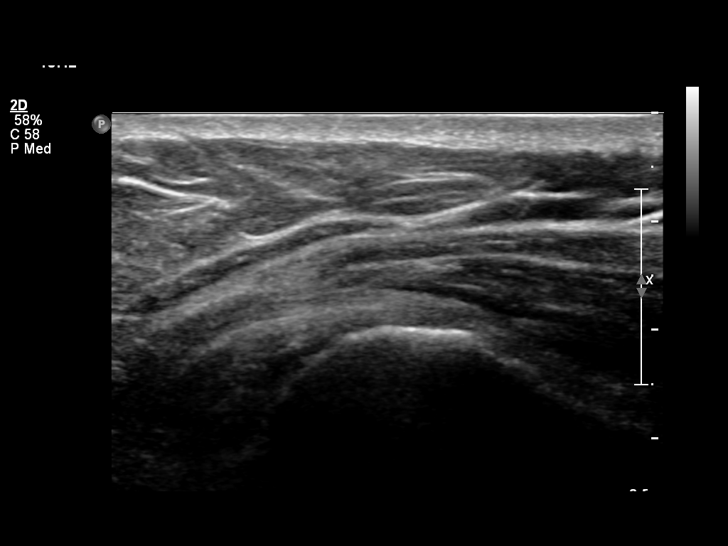
[im 8/11]
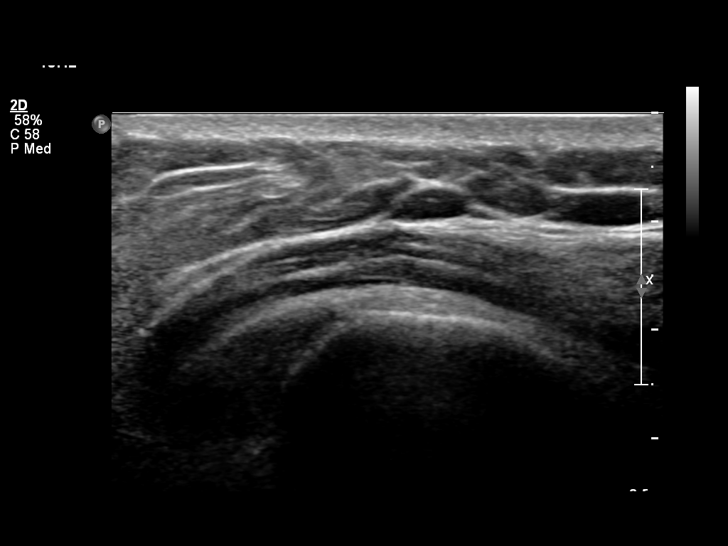
[im 9/11]
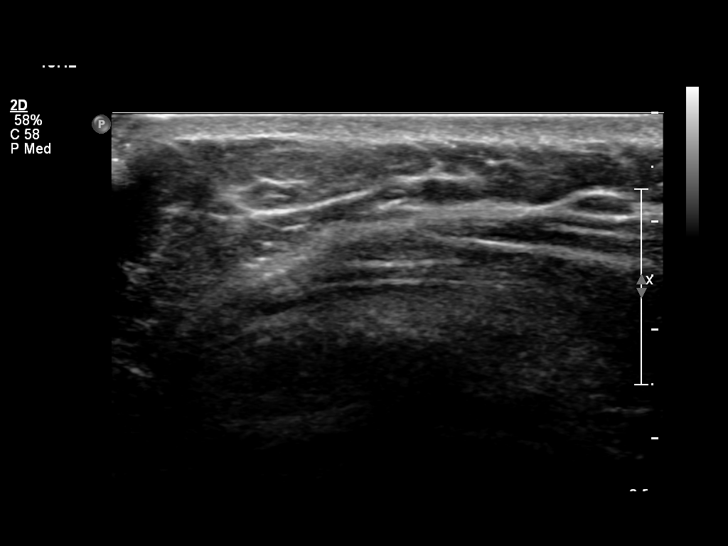
[im 10/11]
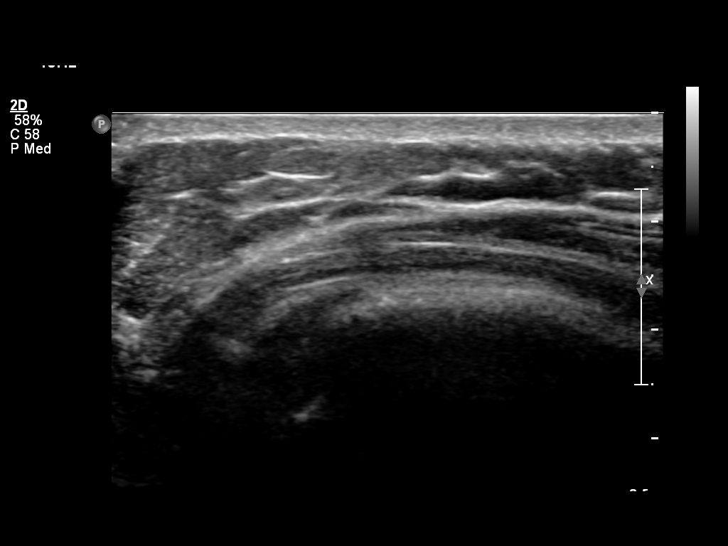
[im 11/11]
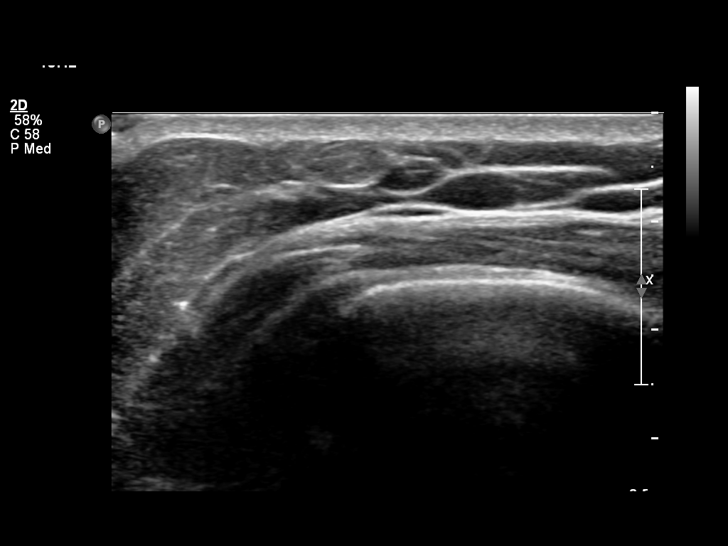

[11 of 11 positions shown; findings below may reference images not displayed]

FINDINGS: Sonographic evaluation the area of palpable concern does not demonstrate underlying fluid collection, mass lesion or skin thickening.
IMPRESSION: 
IMPRESSION: Unremarkable Limited evaluation of the area of concern. If this is part of persistent concern recommend MRI.

## 2022-03-01 IMAGING — MG MAMMO BREAST SCREENING TOMOSYNTHESIS BILATERAL
8 of 15 series · 8 of 31 positions shown · non-contrast
Comparison: [DATE], [id number]

This is a summary report. The complete report is available in the patient's medical record. If you cannot access the medical record, please contact the sending organization for a detailed fax or copy.
FINAL REPORT:
EXAM: MAMMO BREAST SCREENING TOMOSYNTHESIS 3D BILATERAL
CLINICAL HISTORY: Screening mammogram. History of left partial mastectomy for breast cancer
TECHNIQUE: 2-D/3-D digital mammography was performed with CAD. Routine views were obtained.

[R XCCL]
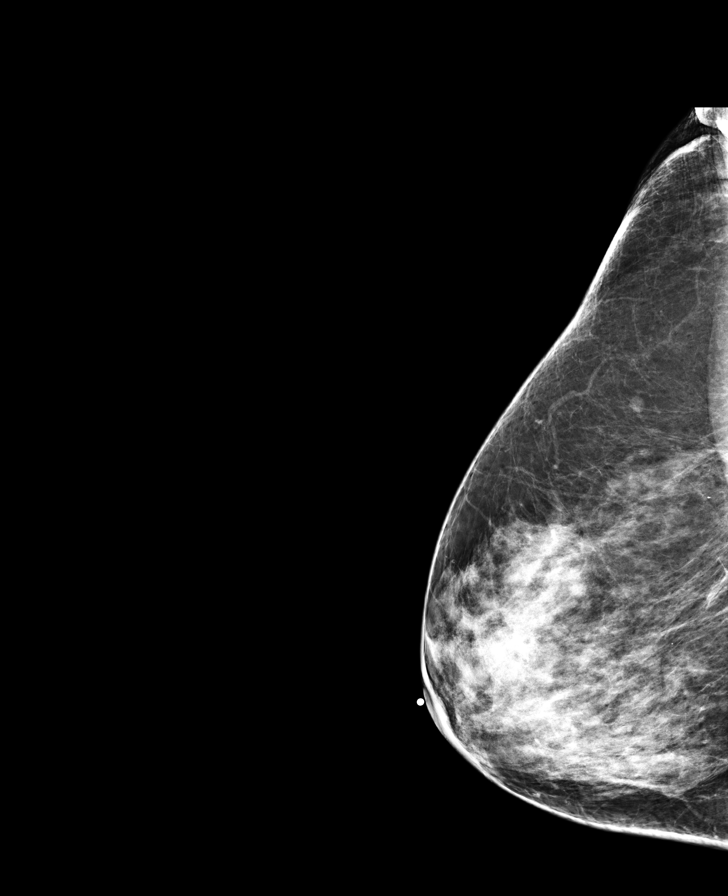

[L XCCL]
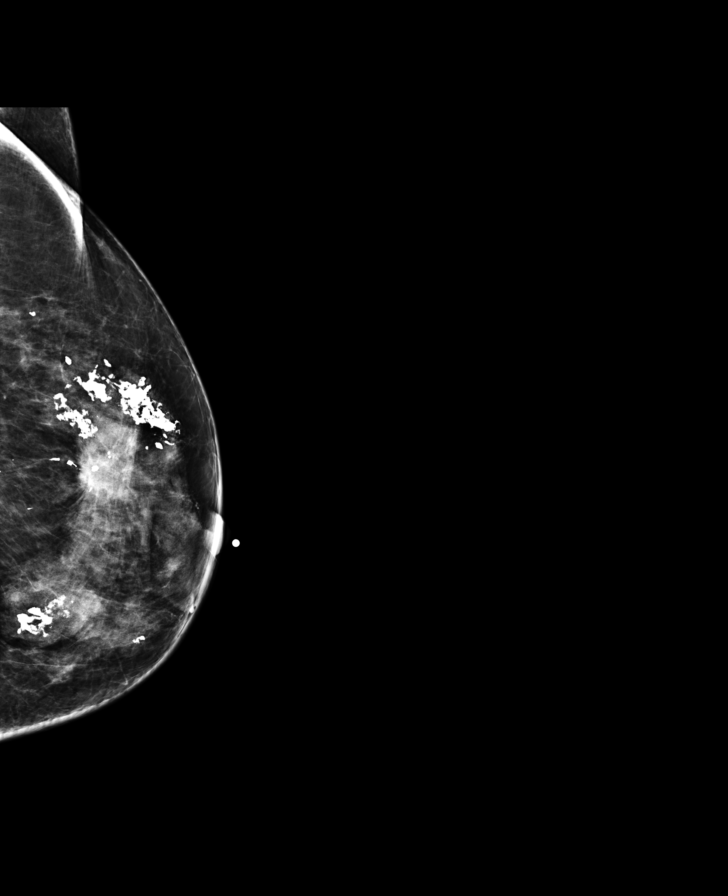

[L CC (1 of 2)]
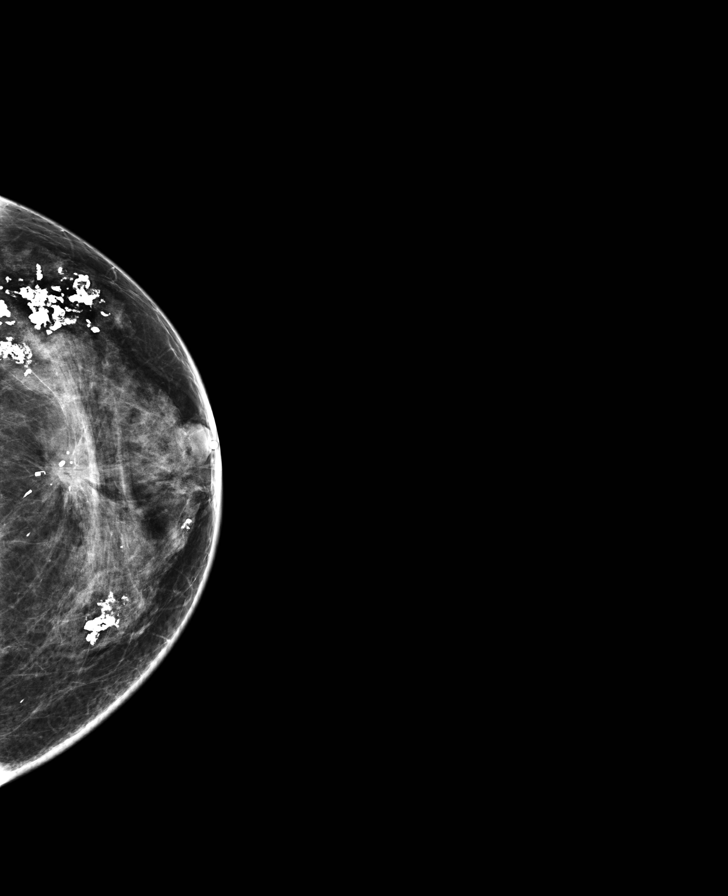

[L MLO synth-2D]
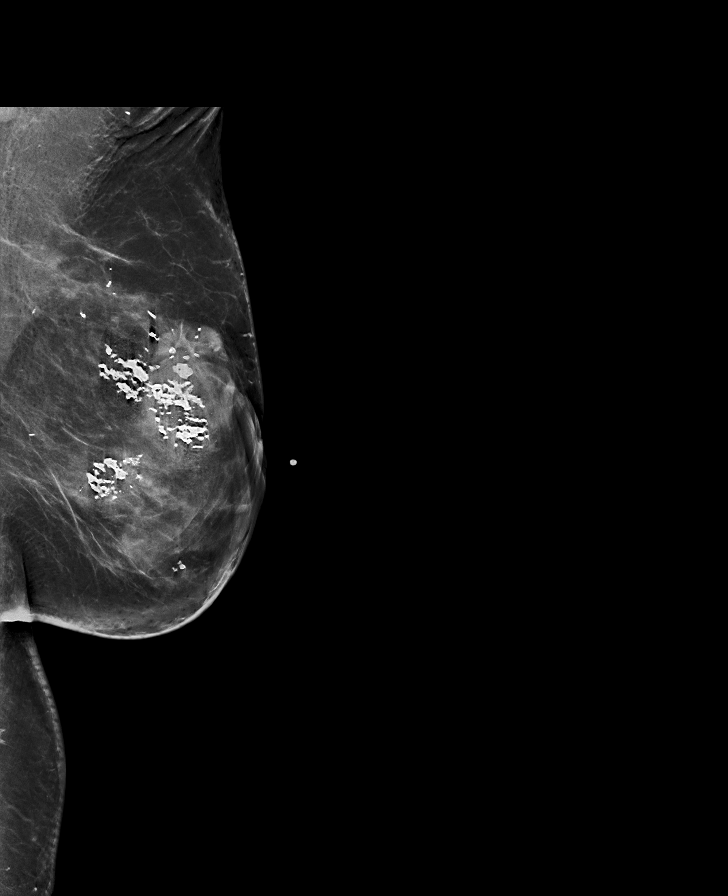

[L CC (2 of 2)]
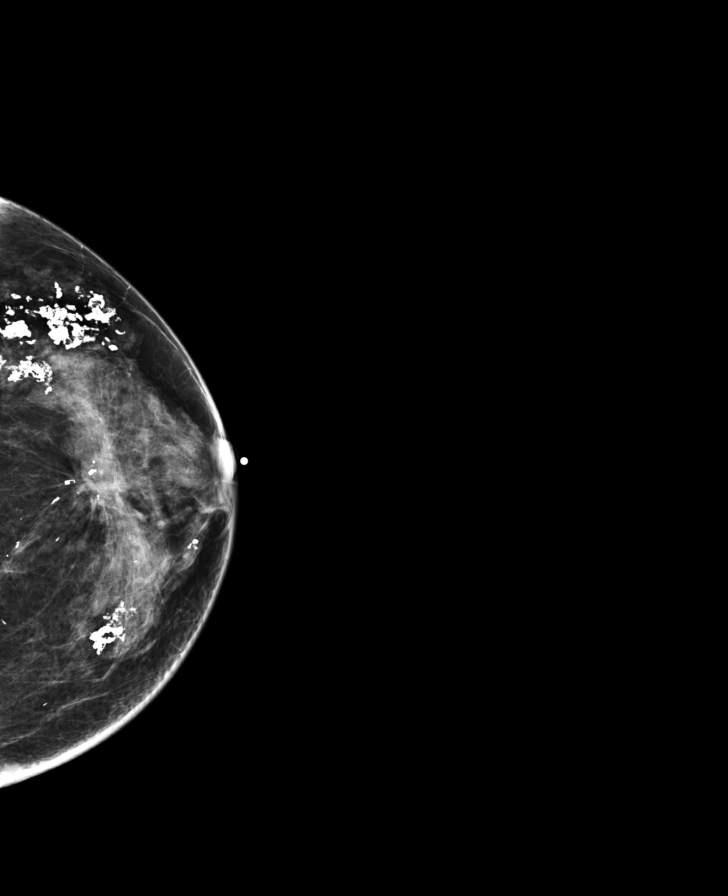

[L CC synth-2D]
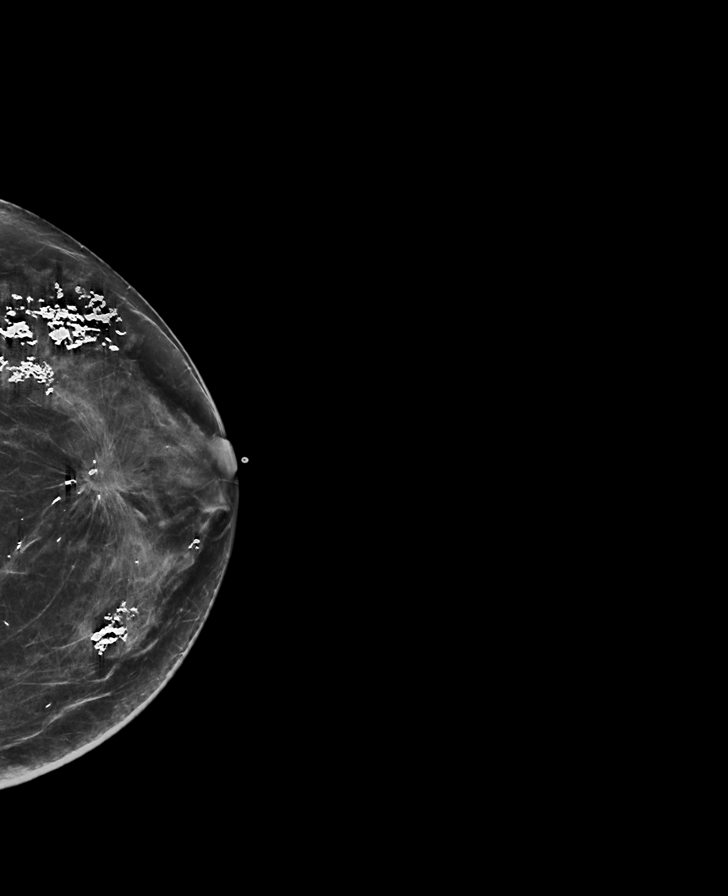

[R CC]
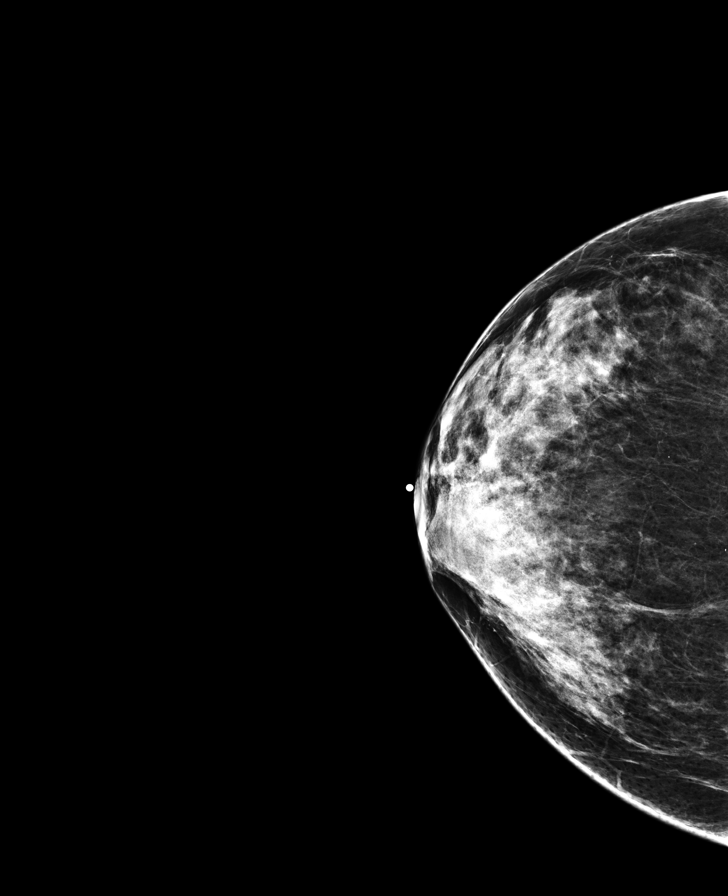

[R CC synth-2D]
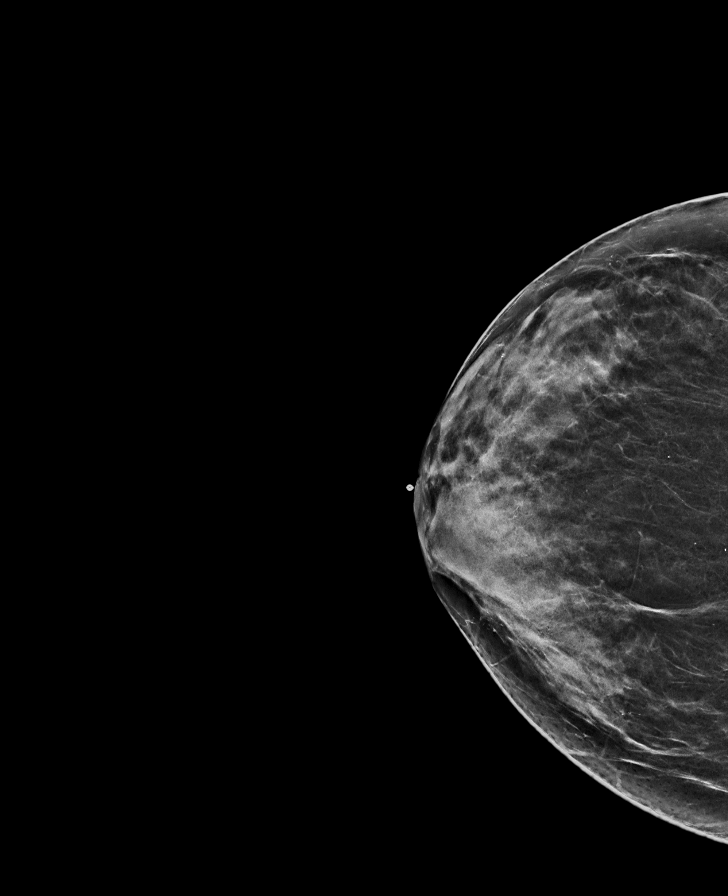

[8 of 31 positions shown; findings below may reference images not displayed]

FINDINGS: No suspicious masses, suspicious microcalcifications, or unexplained distortion. Post treatment changes and biopsy clip are again noted in the left breast.
IMPRESSION: No evidence of malignancy.
BI-RADS Category 2: Benign
Breast Density C: The breasts are heterogeneously dense, which may obscure small masses
Recommendation: Annual screening

## 2023-07-02 IMAGING — CT CT CALCIUM SCORING WITHOUT CONTRAST
1 series · 15 of 20 positions shown, 19 images · non-contrast
Comparison: none

Coronary Calcium Score Report
Patient Name: Bao Vollmer
ID: [id number]
PROCEDURE: Coronary Calcium Score
PROTOCOL: Diastolic phase non-contrast imaging of the heart using CT scanner.  Images analyzed and quantified on dedicated cardiac workstation. Non-cardiac findings reported separately.
CORONARY FINDINGS:
Volume Score
LM:  0
LAD: 0
LCx:  0
RCA: 0
Total: 0
Agatston Score
LM:   0
Other:
RESULTS:
Total Volume (mm3) (used for following progression): 0
Total Agatston Score (used for initial quantification): 0
Overall Calcified Plaque Burden: none
SUMMARY/RECOMMENDATIONS:
No evidence of calcified plaque, consider repeat study in 3-4 years
Thank you for the referral.
Zwan, Cheekiong
·        Coronary calcium scoring has been validated in the 45-84 year old asymptomatic age group.
·       The absence of calcification does not exclude the presence of lipid-rich non-calcified atherosclerotic plaque.  The potential for a false negative study is more common in younger age patients (<45 years of age).
·        The MESA percentile compares CAD burden for a group of patients matched be age, gender, and ethnicity.  A higher % (e.g. 90%) means that the patient has more CAD than their peers.
·        Score and recommendations:
o    0 = no evidence of calcified plaque, consider repeat study in 3-4 years
o    1-99 = mild CAD burden, unlikely to have obstructive disease, focus on risk factor control
o    100-399 = moderate CAD burden, possible obstructive disease, focus on risk factor prevention, functional/stress testing can be considered
o    >/=400 = high CAD burden, recommend functional/stress testing to evaluate for obstructive coronary disease, recommend aggressive risk factor control
o    In cases where the MESA% is >75% (even in the absence of high CAD burden) consider aggressive risk factor control as patient has higher long-term risk of cardiac events than matched peers

[Series 2: calcium score · axial · 0.45mm/px · z∈[-188,-63]mm · 15 of 56 slices shown, 19 images]
[im 3/56  vessel]
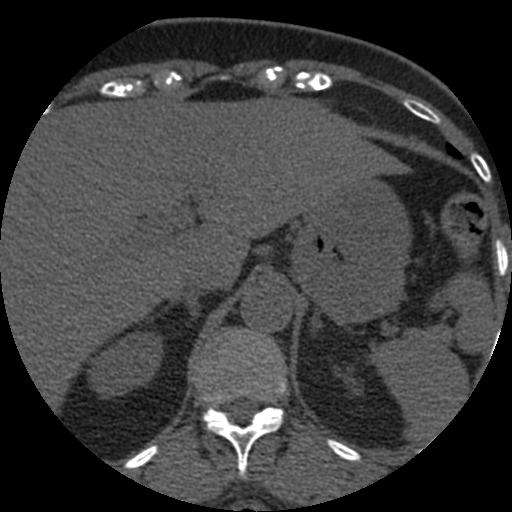
[im 3/56  lung]
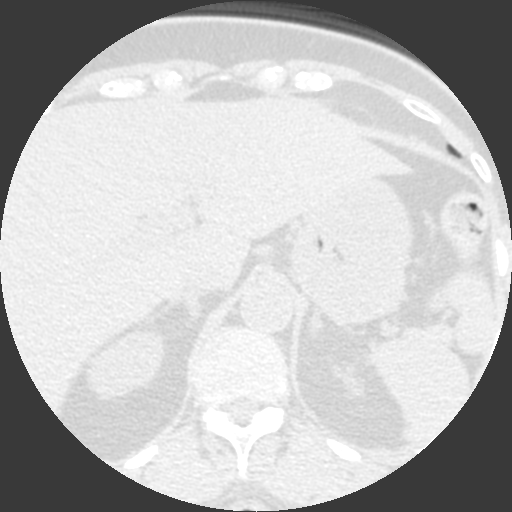
[im 6/56  vessel]
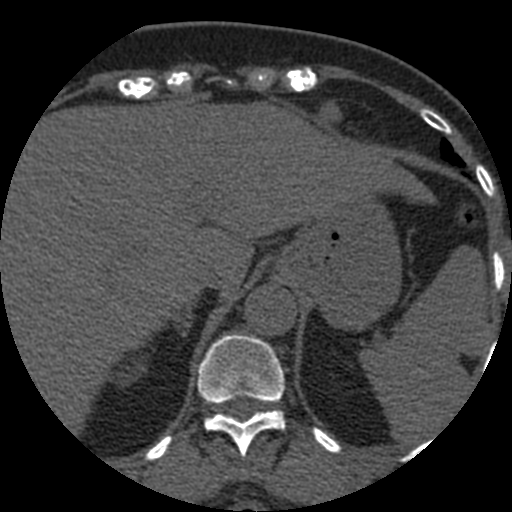
[im 12/56  vessel]
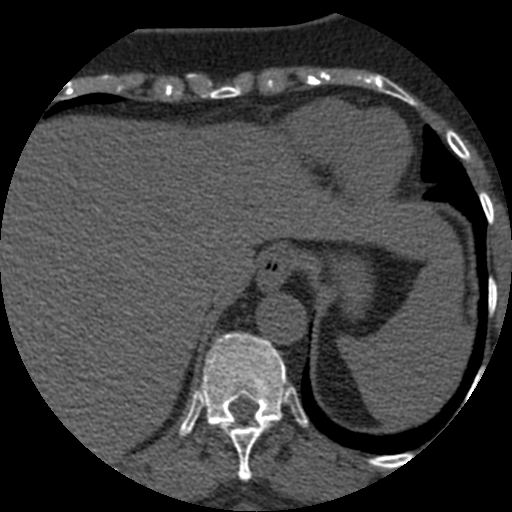
[im 15/56  vessel]
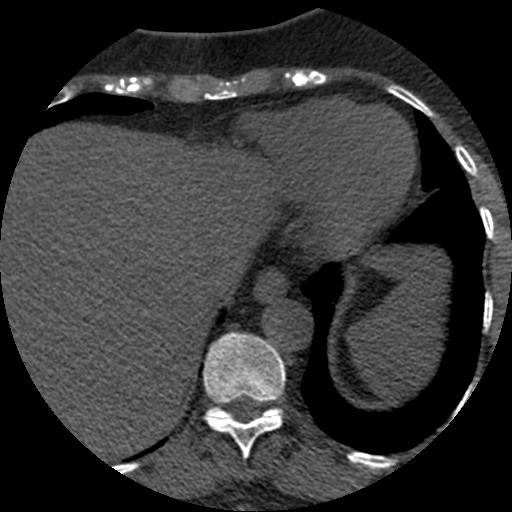
[im 18/56  vessel]
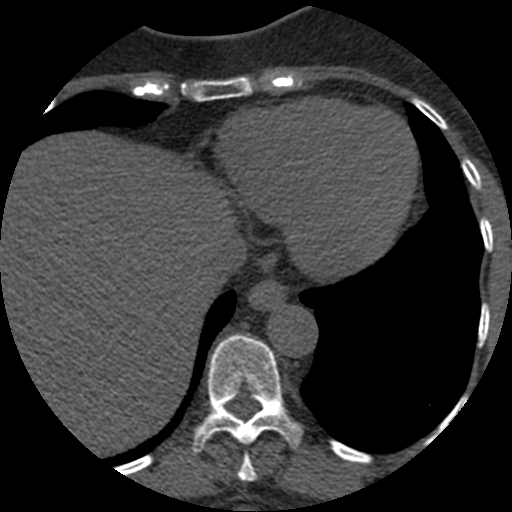
[im 18/56  lung]
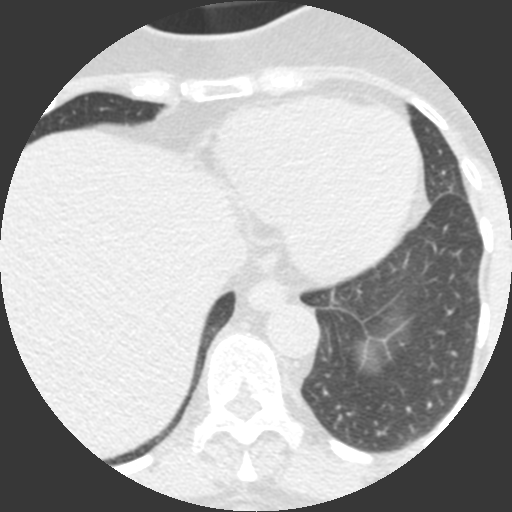
[im 21/56  vessel]
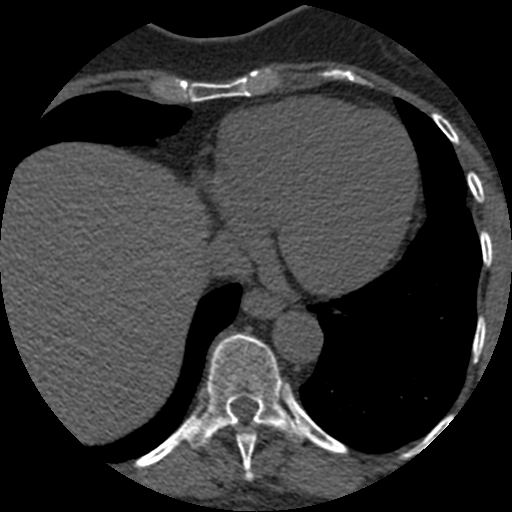
[im 24/56  vessel]
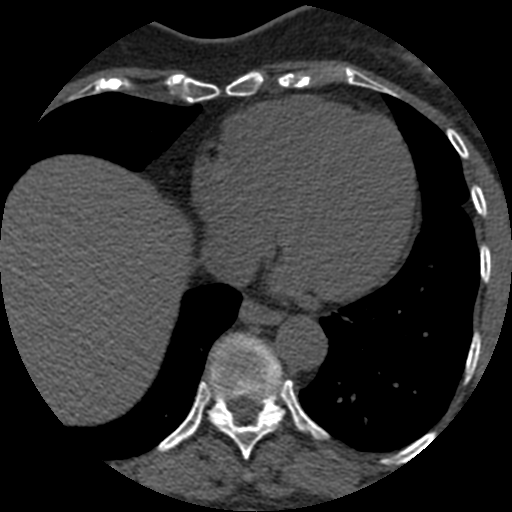
[im 29/56  vessel]
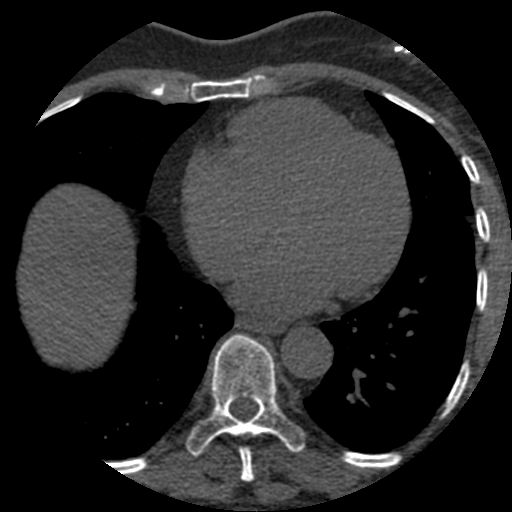
[im 32/56  vessel]
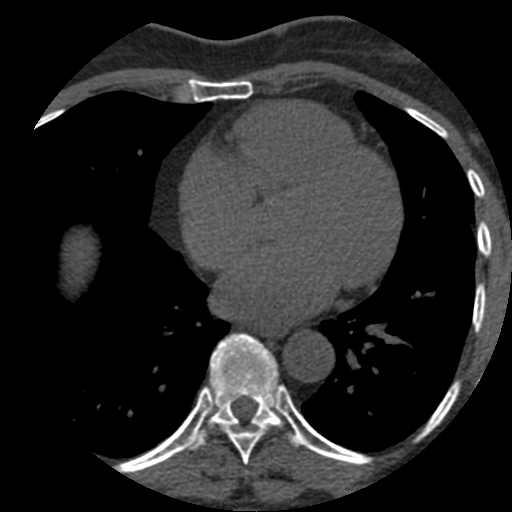
[im 32/56  lung]
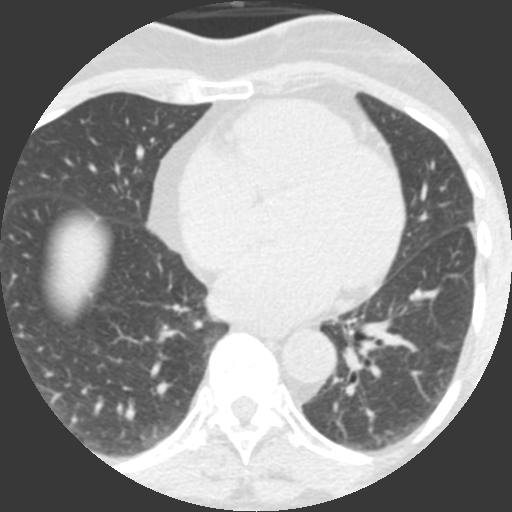
[im 35/56  vessel]
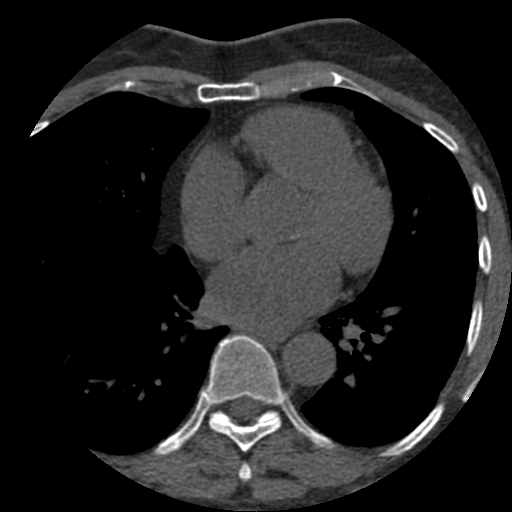
[im 38/56  vessel]
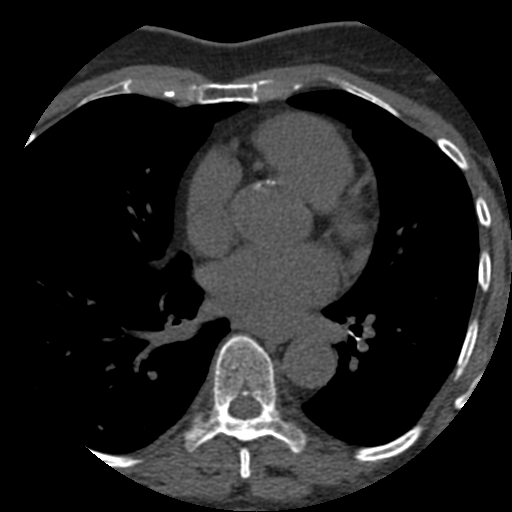
[im 41/56  vessel]
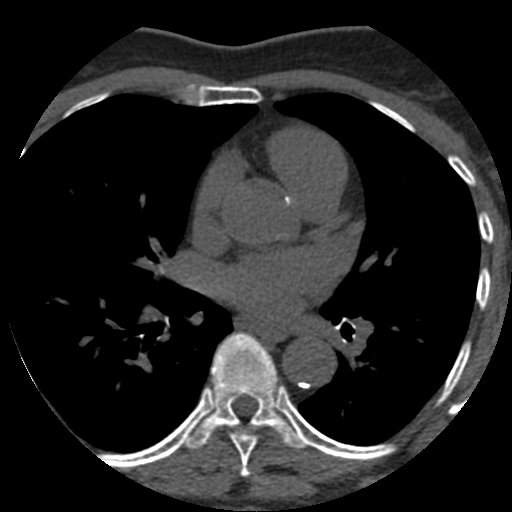
[im 47/56  vessel]
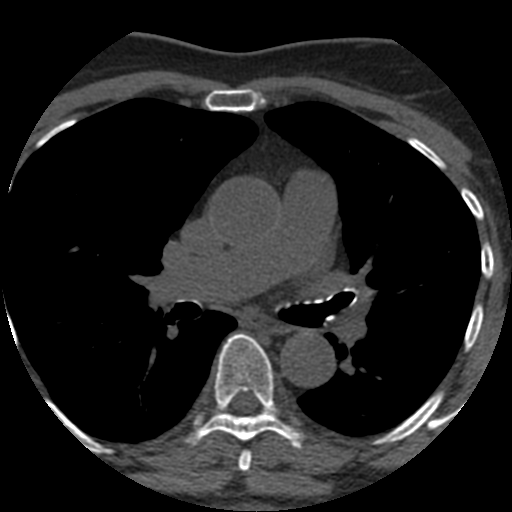
[im 47/56  lung]
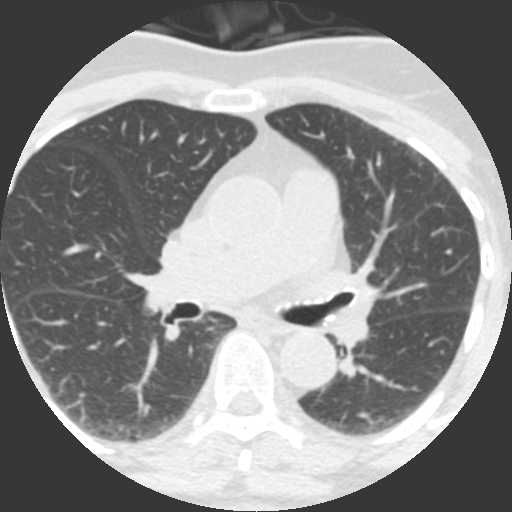
[im 50/56  vessel]
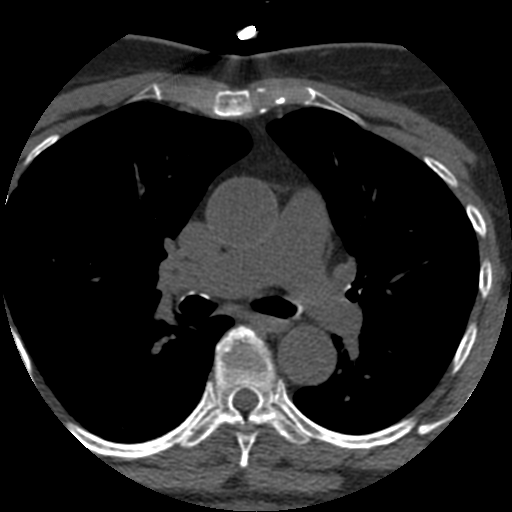
[im 53/56  vessel]
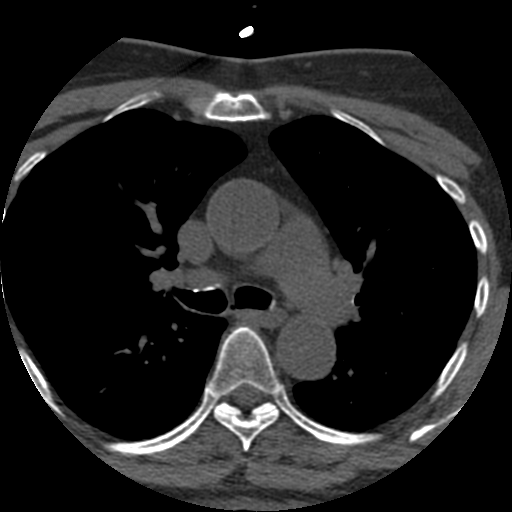

[15 of 20 positions shown; findings below may reference images not displayed]

## 2023-07-02 IMAGING — CT CT LUNG FIELD REVIEW
1 series · 15 of 31 positions shown, 19 images · non-contrast
Comparison: none

FINAL REPORT:
HISTORY: Screening for cardiovascular disorder.
EXAM: Limited CT of the chest.
TECHNIQUE: Axial CT images obtained through the chest partial for cardiac calcium scoring.

[Series 302: lung · axial · 0.70mm/px · z∈[-188,-63]mm · 15 of 56 slices shown, 19 images]
[im 3/56  mediastinal]
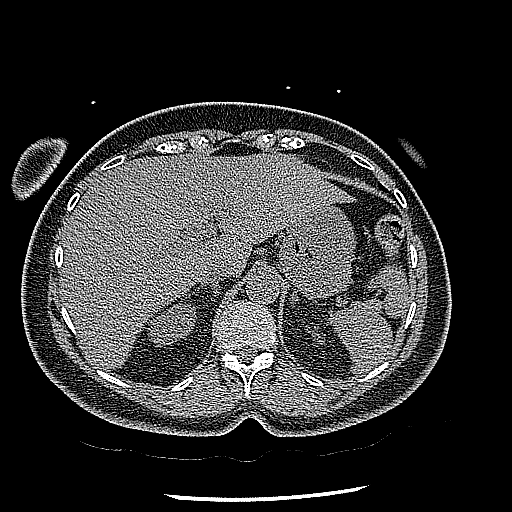
[im 3/56  lung]
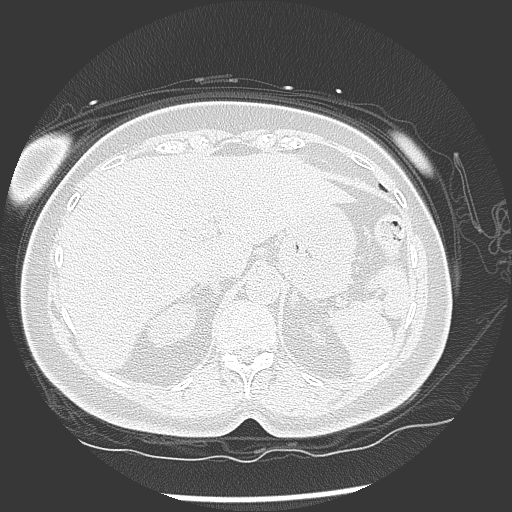
[im 7/56  lung]
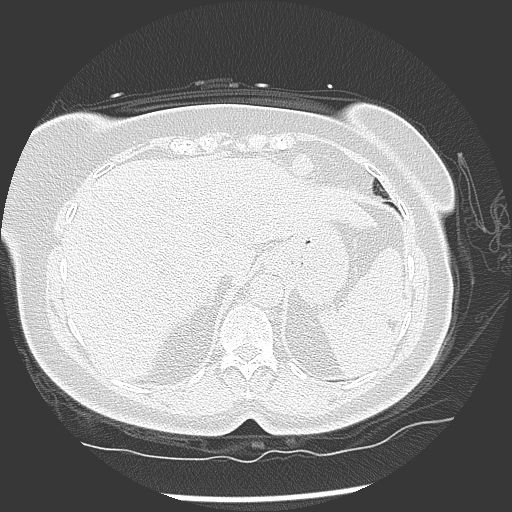
[im 11/56  lung]
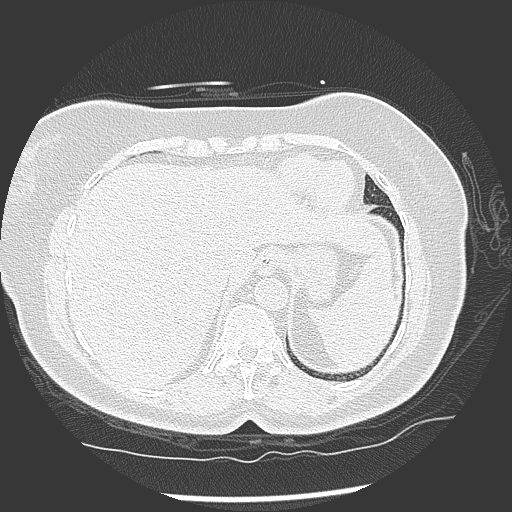
[im 13/56  lung]
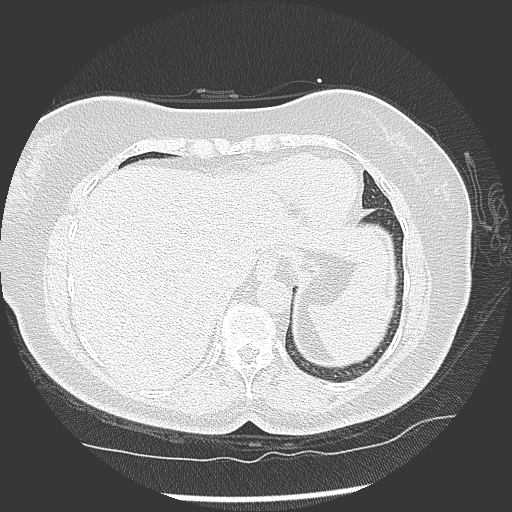
[im 17/56  mediastinal]
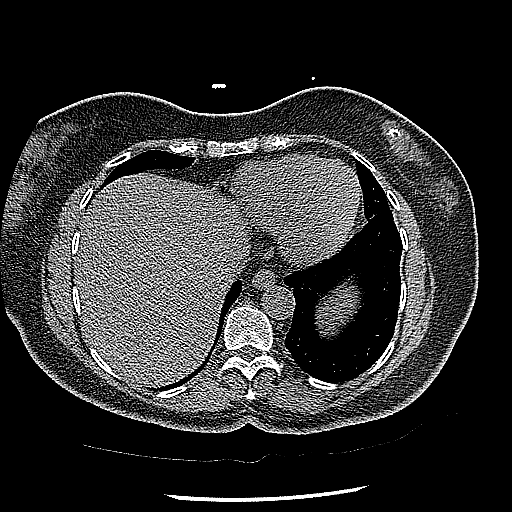
[im 17/56  lung]
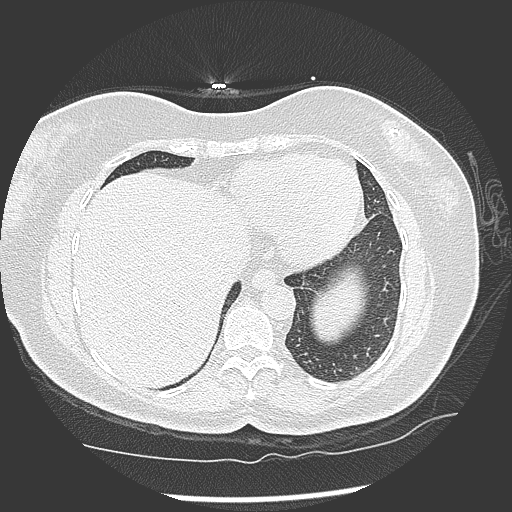
[im 21/56  lung]
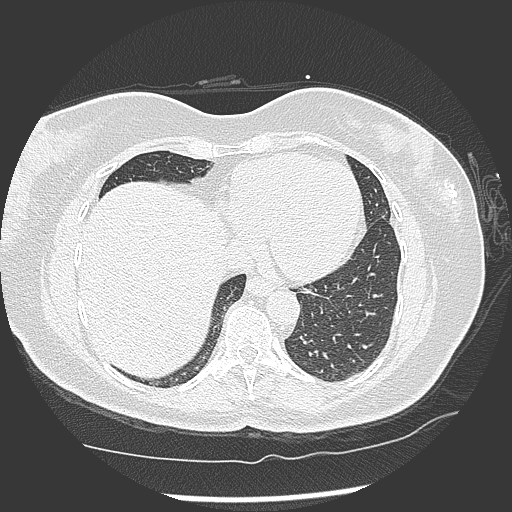
[im 25/56  lung]
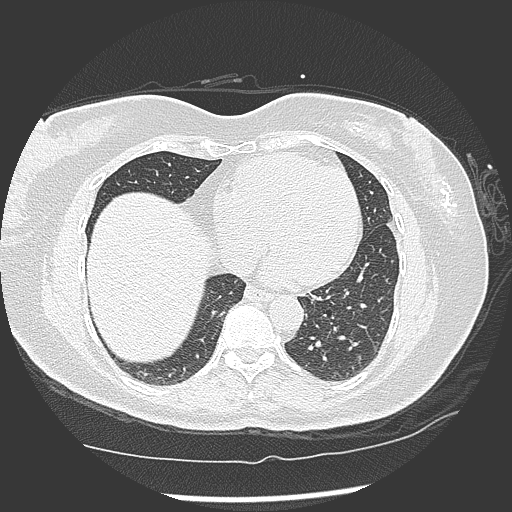
[im 29/56  lung]
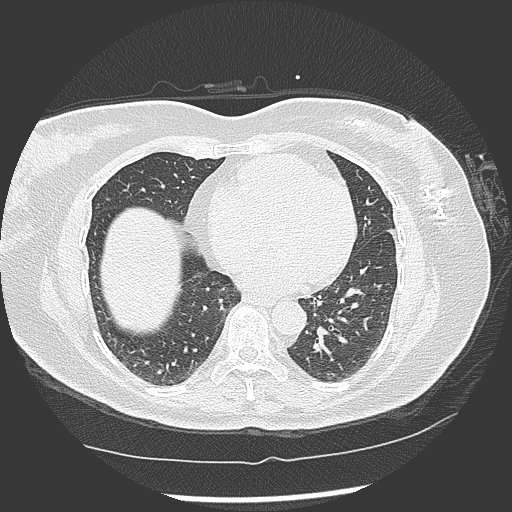
[im 32/56  mediastinal]
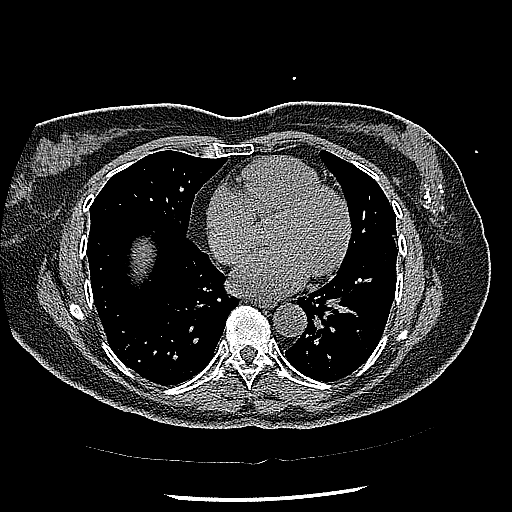
[im 32/56  lung]
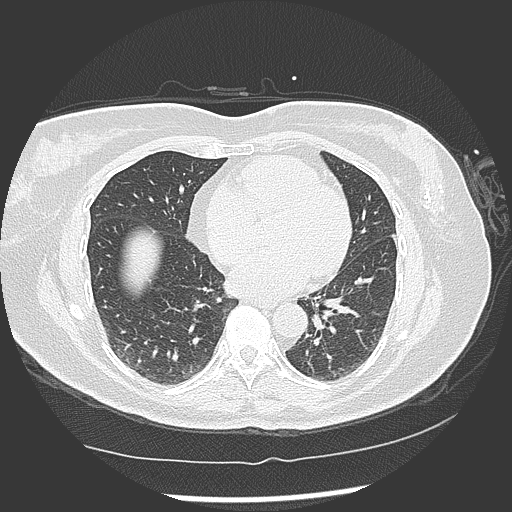
[im 34/56  lung]
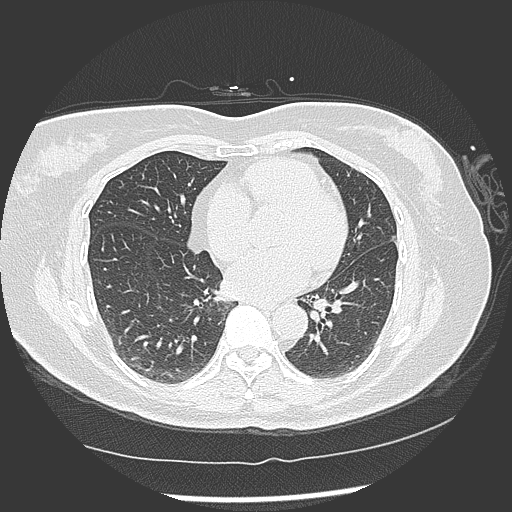
[im 37/56  lung]
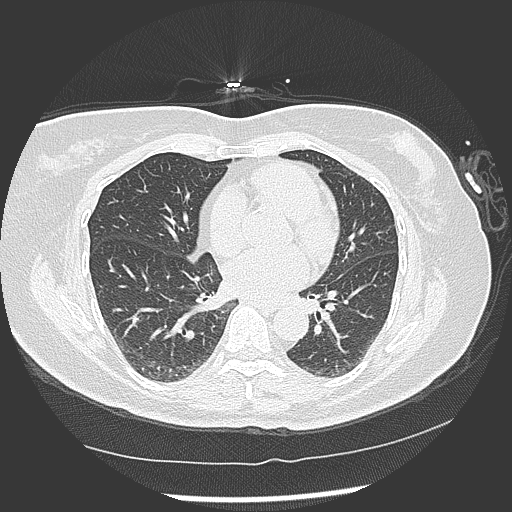
[im 41/56  lung]
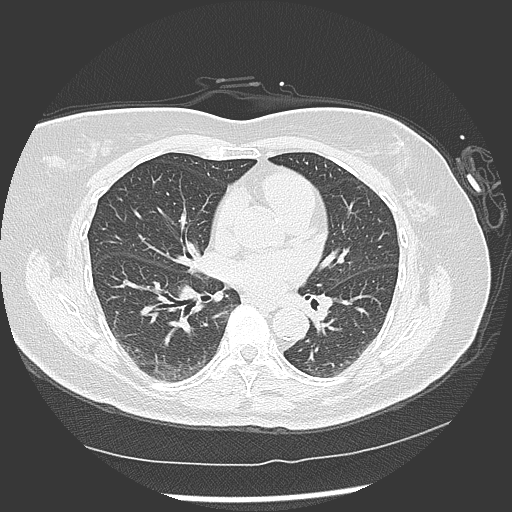
[im 45/56  mediastinal]
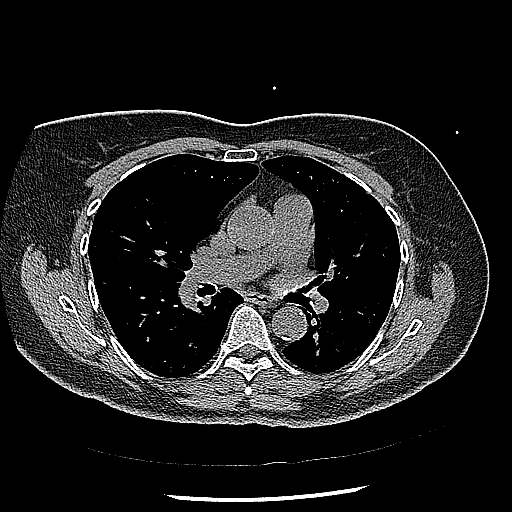
[im 45/56  lung]
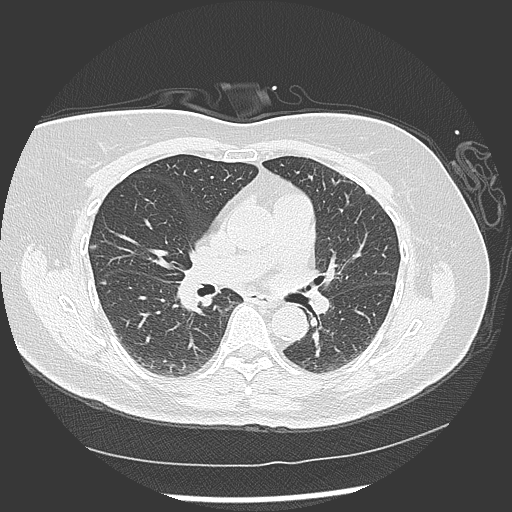
[im 49/56  lung]
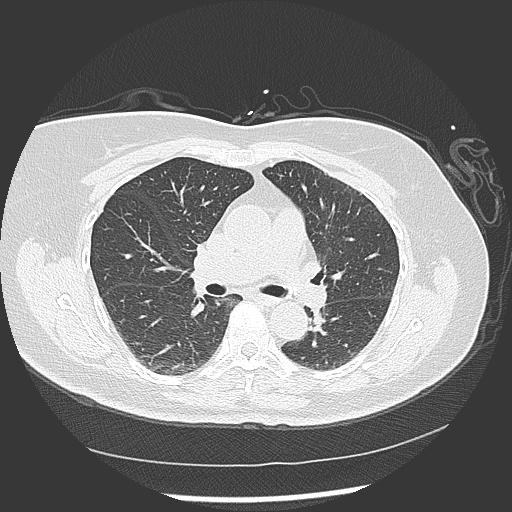
[im 53/56  lung]
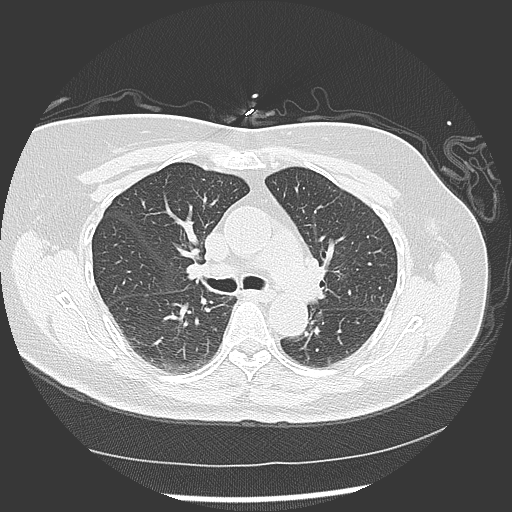

[15 of 31 positions shown; findings below may reference images not displayed]

FINDINGS: There is a right middle lobe pulmonary nodule measuring 3.9 mm image #12. There is a right lower lobe pulmonary nodule on the same image measuring 3 mm. Bone window evaluation demonstrates no acute process. Soft tissue evaluation demonstrates multiple breast calcifications. There is some mild arthrosclerotic disease. There is subtle coronary artery calcification.
IMPRESSION: 1. Right-sided pulmonary nodules measuring up to 3.9 mm. If there is a clinically significant history of smoking or cancer, a follow-up CT of the chest without contrast is recommended in 12 months. Otherwise no further imaging follow-up is required per 7764 Fleischner guidelines.
.
2. Mild arthrosclerotic disease.
All CT scans at this facility use interactive reconstruction technique, dose modulation, and/or weight based dose seen within appropriate to reduce radiation dose to as low as reasonably achievable.

## 2024-01-23 IMAGING — MR MRI SHOULDER RIGHT WITHOUT IV CONTRAST
7 series · 40 of 40 positions shown · non-contrast
Comparison: None available

FINAL REPORT:
HISTORY: Pain in right arm
Procedure: MRI of the right shoulder without contrast
TECHNIQUE: Multisequence, multiplanar imaging of the right shoulder was performed.

[Series 2001: survey_mpr_sag · oblique · right · 1.6mm · 1.56mm/px · 2 of 9 slices shown]
[im 1/9]
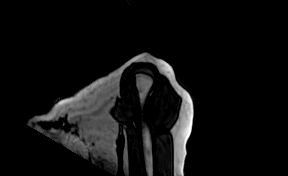
[im 9/9]
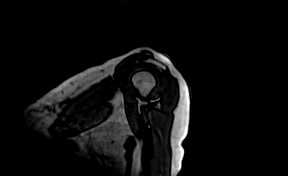

[Series 3001: survey_mpr_cor · oblique · right · 1.6mm · 1.56mm/px · 3 of 11 slices shown]
[im 1/11]
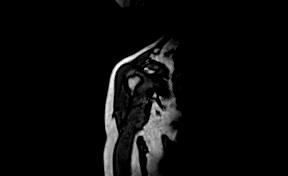
[im 6/11]
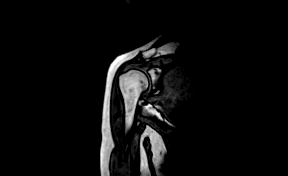
[im 11/11]
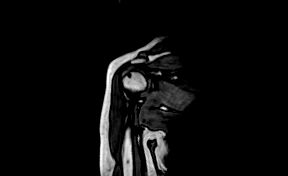

[Series 4001: survey_mpr_(person_name) · axial · right · 1.6mm · 1.56mm/px · z∈[+4,+46]mm · 3 of 15 slices shown]
[im 1/15]
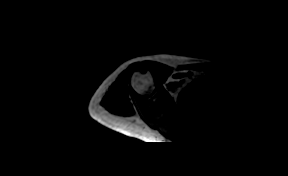
[im 8/15]
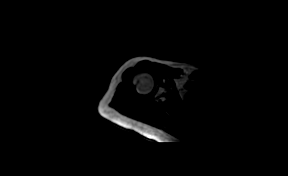
[im 15/15]
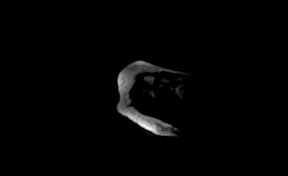

[Series 5001: PD fat-sat · axial · right · 3.0mm · 0.47mm/px · z∈[-21,+78]mm · 8 of 35 slices shown (1 of 2)]
[im 1/35]
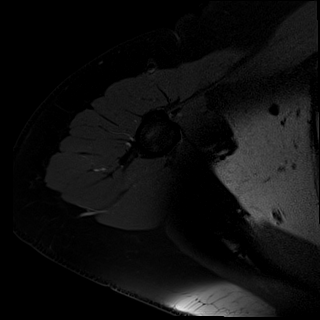
[im 5/35]
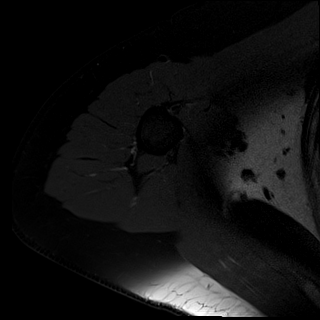
[im 10/35]
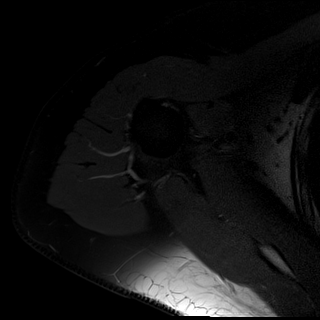
[im 15/35]
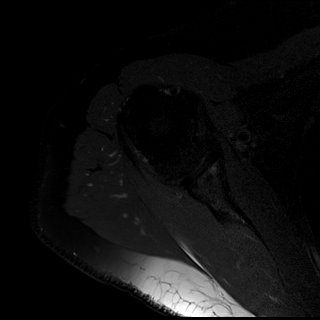
[im 20/35]
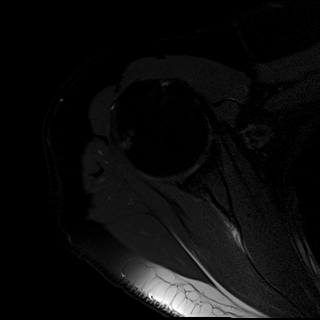
[im 25/35]
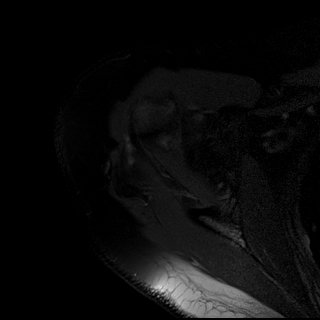
[im 30/35]
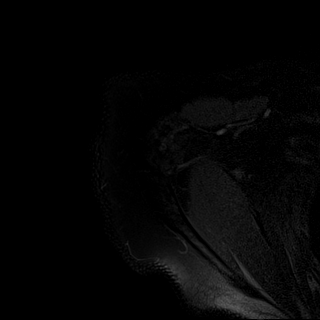
[im 35/35]
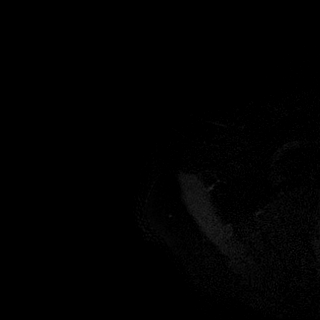

[Series 6001: PD fat-sat · oblique · right · 3.0mm · 0.42mm/px · 8 of 35 slices shown (2 of 2)]
[im 1/35]
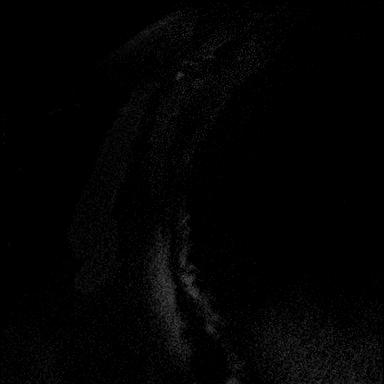
[im 5/35]
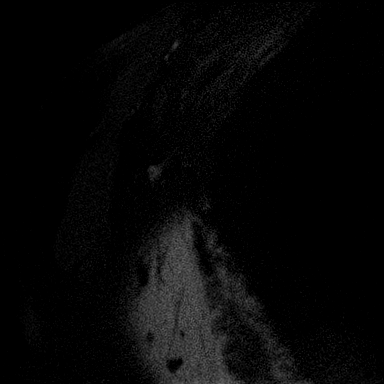
[im 10/35]
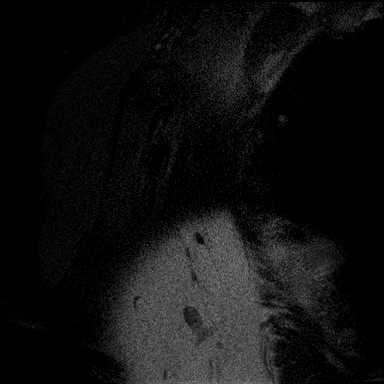
[im 15/35]
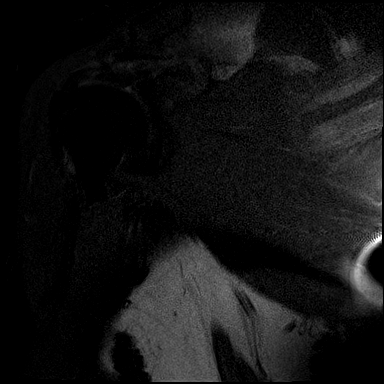
[im 20/35]
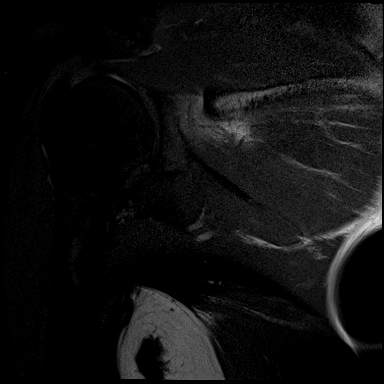
[im 25/35]
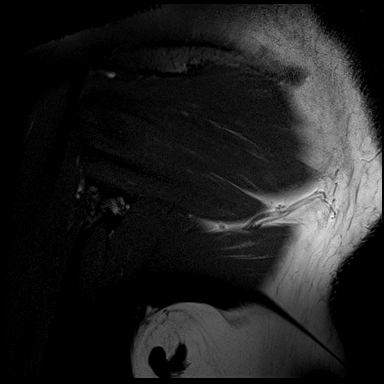
[im 30/35]
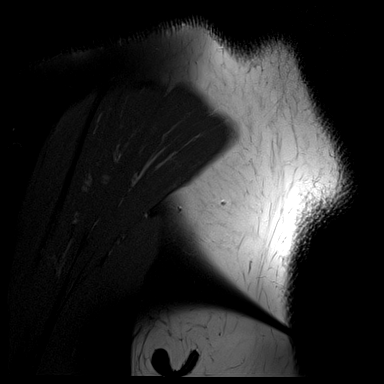
[im 35/35]
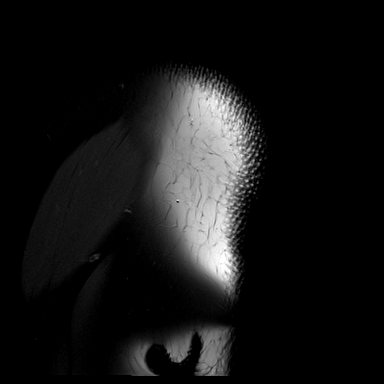

[Series 7001: T2 fat-sat · oblique · right · 3.0mm · 0.47mm/px · 8 of 35 slices shown]
[im 1/35]
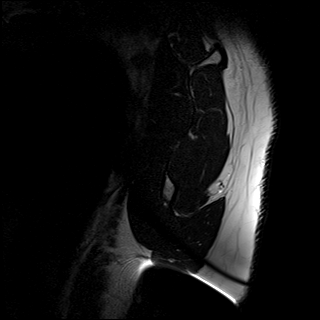
[im 5/35]
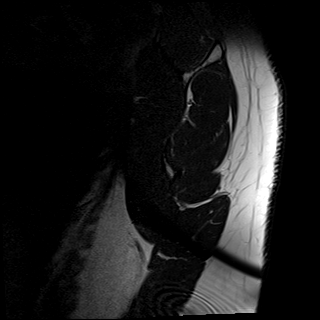
[im 10/35]
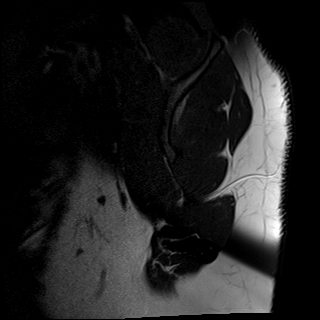
[im 15/35]
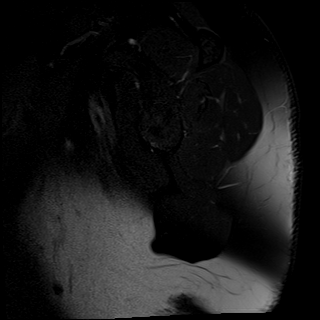
[im 20/35]
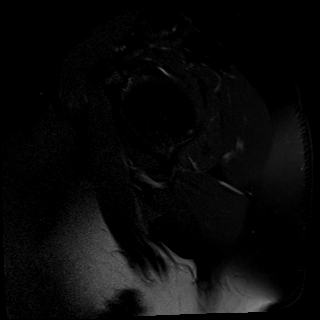
[im 25/35]
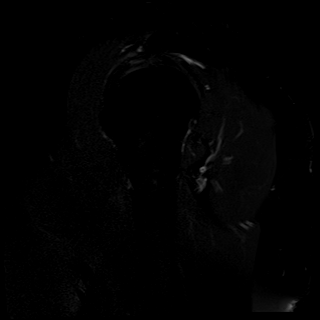
[im 30/35]
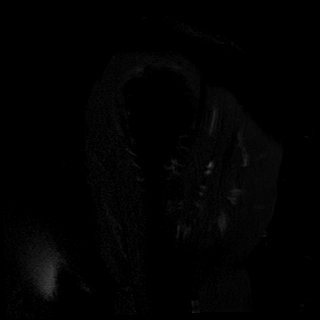
[im 35/35]
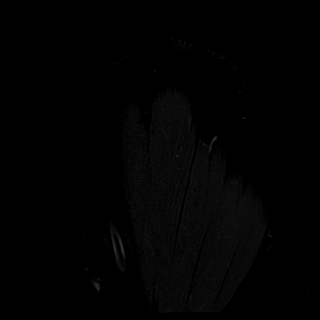

[Series 8001: T1 · oblique · right · 3.0mm · 0.39mm/px · 8 of 35 slices shown]
[im 1/35]
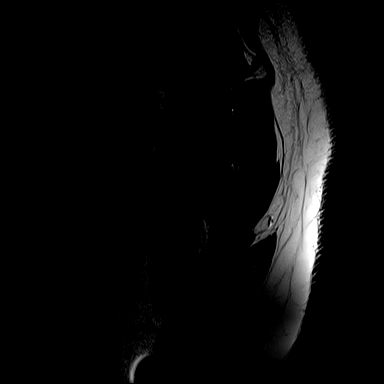
[im 5/35]
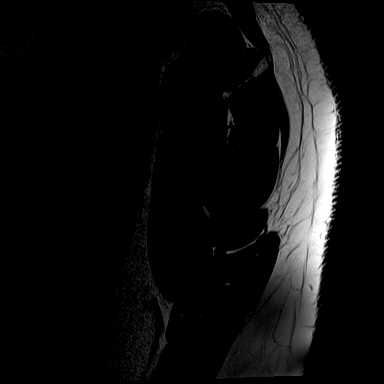
[im 10/35]
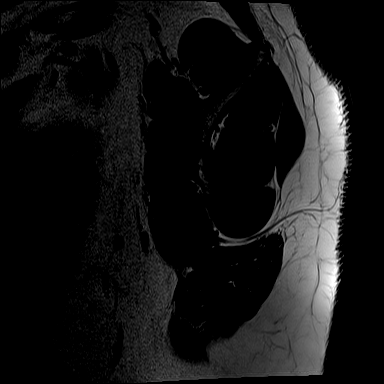
[im 15/35]
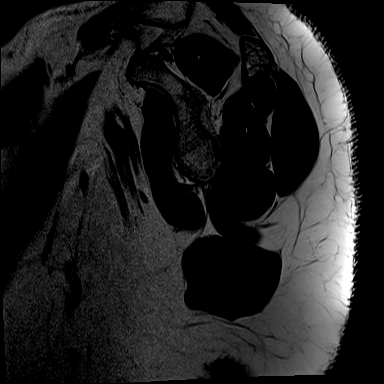
[im 20/35]
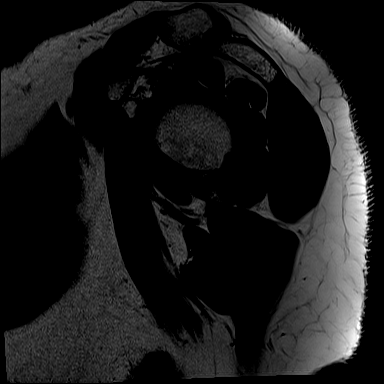
[im 25/35]
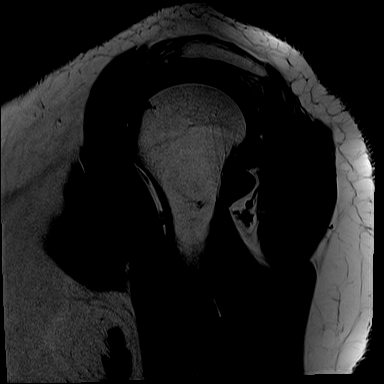
[im 30/35]
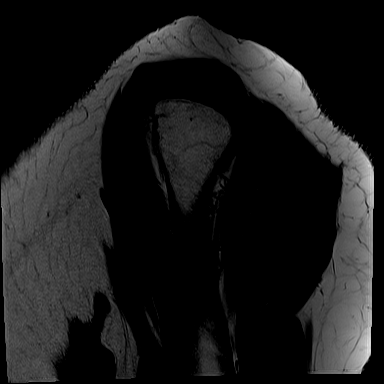
[im 35/35]
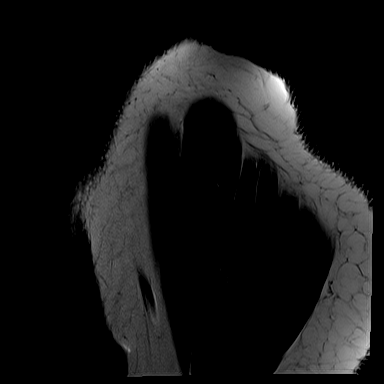

[40 of 40 positions shown; findings below may reference images not displayed]

FINDINGS: Low-lying acromion. Acromioclavicular degenerative change. Mild subacromial subdeltoid bursitis. Intermediate grade partial tear of the distal supraspinatus with bursal and articular surface components. Low-grade tear anteriorly at the insertion. Background of tendinopathy. There is a tear at the conjoined tendon which is vertical and nearly full-thickness best seen on images 31 and 32 of series 5667. Infraspinatus is normal. Teres minor is normal. Subscapularis is unremarkable. Biceps is intact. No labral tear is seen.
IMPRESSION: 
IMPRESSION: 1.  High-grade partial tear of the conjoined tendon.
2.  Intermediate grade partial tear of the distal supraspinatus.
3.  Mild subacromial subdeltoid bursitis.
4.  Low-lying acromion and acromioclavicular degenerative change.
